# Patient Record
Sex: Female | Born: 1985 | Race: White | Hispanic: No | Marital: Married | Smoking: Never smoker
Health system: Southern US, Community
[De-identification: ages and names within clinical notes are randomized; demographics above are authoritative.]

## PROBLEM LIST (undated history)

## (undated) ENCOUNTER — Ambulatory Visit: Admission: EM | Payer: PRIVATE HEALTH INSURANCE | Source: Home / Self Care

## (undated) DIAGNOSIS — K589 Irritable bowel syndrome without diarrhea: Secondary | ICD-10-CM

## (undated) DIAGNOSIS — Z8489 Family history of other specified conditions: Secondary | ICD-10-CM

## (undated) DIAGNOSIS — D649 Anemia, unspecified: Secondary | ICD-10-CM

## (undated) DIAGNOSIS — I341 Nonrheumatic mitral (valve) prolapse: Secondary | ICD-10-CM

## (undated) DIAGNOSIS — Z87442 Personal history of urinary calculi: Secondary | ICD-10-CM

---
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
## 2016-09-16 DIAGNOSIS — J309 Allergic rhinitis, unspecified: Secondary | ICD-10-CM | POA: Insufficient documentation

## 2016-09-16 DIAGNOSIS — F909 Attention-deficit hyperactivity disorder, unspecified type: Secondary | ICD-10-CM | POA: Insufficient documentation

## 2017-07-16 HISTORY — PX: LAPAROSCOPY: SHX197

## 2018-10-21 LAB — OB RESULTS CONSOLE RUBELLA ANTIBODY, IGM: Rubella: IMMUNE

## 2018-10-21 LAB — OB RESULTS CONSOLE HEPATITIS B SURFACE ANTIGEN: Hepatitis B Surface Ag: NEGATIVE

## 2018-12-16 NOTE — L&D Delivery Note (Signed)
Delivery Note  SVD viable female Apgars 9,10 over 2nd degree MLE.  Placenta delivered spontaneously intact with 3VC. Repair with 2-0 Chromic with good support and hemostasis noted.  R/V exam confirms.  PH art was sent.   Mother and baby to couplet care and are doing well.  EBL 508cc  Candice Camp, MD

## 2019-02-18 LAB — OB RESULTS CONSOLE HIV ANTIBODY (ROUTINE TESTING): HIV: NONREACTIVE

## 2019-04-01 LAB — OB RESULTS CONSOLE GC/CHLAMYDIA
Chlamydia: NEGATIVE
Gonorrhea: NEGATIVE

## 2019-04-29 LAB — OB RESULTS CONSOLE GBS: GBS: NEGATIVE

## 2019-05-18 ENCOUNTER — Other Ambulatory Visit: Payer: Self-pay

## 2019-05-18 ENCOUNTER — Inpatient Hospital Stay (HOSPITAL_COMMUNITY)
Admission: AD | Admit: 2019-05-18 | Discharge: 2019-05-20 | DRG: 807 | Disposition: A | Discharge status: Home / Self Care | Payer: PRIVATE HEALTH INSURANCE | Attending: Obstetrics and Gynecology | Admitting: Obstetrics and Gynecology

## 2019-05-18 ENCOUNTER — Encounter (HOSPITAL_COMMUNITY): Payer: Self-pay

## 2019-05-18 ENCOUNTER — Inpatient Hospital Stay (HOSPITAL_COMMUNITY): Payer: PRIVATE HEALTH INSURANCE | Admitting: Anesthesiology

## 2019-05-18 ENCOUNTER — Inpatient Hospital Stay (HOSPITAL_COMMUNITY): Payer: PRIVATE HEALTH INSURANCE

## 2019-05-18 DIAGNOSIS — Z3A38 38 weeks gestation of pregnancy: Secondary | ICD-10-CM

## 2019-05-18 DIAGNOSIS — O134 Gestational [pregnancy-induced] hypertension without significant proteinuria, complicating childbirth: Secondary | ICD-10-CM | POA: Diagnosis present

## 2019-05-18 DIAGNOSIS — O0990 Supervision of high risk pregnancy, unspecified, unspecified trimester: Secondary | ICD-10-CM

## 2019-05-18 DIAGNOSIS — Z3490 Encounter for supervision of normal pregnancy, unspecified, unspecified trimester: Secondary | ICD-10-CM

## 2019-05-18 DIAGNOSIS — Z1159 Encounter for screening for other viral diseases: Secondary | ICD-10-CM

## 2019-05-18 HISTORY — DX: Irritable bowel syndrome, unspecified: K58.9

## 2019-05-18 LAB — CBC
HCT: 33.7 % — ABNORMAL LOW (ref 36.0–46.0)
HCT: 33.4 % — ABNORMAL LOW (ref 36.0–46.0)
HCT: 32.9 % — ABNORMAL LOW (ref 36.0–46.0)
Hemoglobin: 11.2 g/dL — ABNORMAL LOW (ref 12.0–15.0)
Hemoglobin: 10.9 g/dL — ABNORMAL LOW (ref 12.0–15.0)
Hemoglobin: 10.9 g/dL — ABNORMAL LOW (ref 12.0–15.0)
MCH: 28.4 pg (ref 26.0–34.0)
MCH: 28.2 pg (ref 26.0–34.0)
MCH: 28.7 pg (ref 26.0–34.0)
MCHC: 33.2 g/dL (ref 30.0–36.0)
MCHC: 32.6 g/dL (ref 30.0–36.0)
MCHC: 33.1 g/dL (ref 30.0–36.0)
MCV: 85.5 fL (ref 80.0–100.0)
MCV: 86.5 fL (ref 80.0–100.0)
MCV: 86.6 fL (ref 80.0–100.0)
Platelets: 287 10*3/uL (ref 150–400)
Platelets: 281 10*3/uL (ref 150–400)
Platelets: 284 10*3/uL (ref 150–400)
RBC: 3.94 MIL/uL (ref 3.87–5.11)
RBC: 3.86 MIL/uL — ABNORMAL LOW (ref 3.87–5.11)
RBC: 3.8 MIL/uL — ABNORMAL LOW (ref 3.87–5.11)
RDW: 13.2 % (ref 11.5–15.5)
RDW: 13.4 % (ref 11.5–15.5)
RDW: 13.4 % (ref 11.5–15.5)
WBC: 13.6 10*3/uL — ABNORMAL HIGH (ref 4.0–10.5)
WBC: 11.1 10*3/uL — ABNORMAL HIGH (ref 4.0–10.5)
WBC: 22.9 10*3/uL — ABNORMAL HIGH (ref 4.0–10.5)
nRBC: 0 % (ref 0.0–0.2)
nRBC: 0 % (ref 0.0–0.2)
nRBC: 0 % (ref 0.0–0.2)

## 2019-05-18 LAB — COMPREHENSIVE METABOLIC PANEL
ALT: 23 U/L (ref 0–44)
AST: 26 U/L (ref 15–41)
Albumin: 3.1 g/dL — ABNORMAL LOW (ref 3.5–5.0)
Alkaline Phosphatase: 135 U/L — ABNORMAL HIGH (ref 38–126)
Anion gap: 10 (ref 5–15)
BUN: 12 mg/dL (ref 6–20)
CO2: 19 mmol/L — ABNORMAL LOW (ref 22–32)
Calcium: 9.4 mg/dL (ref 8.9–10.3)
Chloride: 105 mmol/L (ref 98–111)
Creatinine, Ser: 0.7 mg/dL (ref 0.44–1.00)
GFR calc Af Amer: >60 mL/min (ref >60)
GFR calc non Af Amer: >60 mL/min (ref >60)
Glucose, Bld: 85 mg/dL (ref 70–99)
Potassium: 3.6 mmol/L (ref 3.5–5.1)
Sodium: 134 mmol/L — ABNORMAL LOW (ref 135–145)
Total Bilirubin: 0.4 mg/dL (ref 0.3–1.2)
Total Protein: 5.8 g/dL — ABNORMAL LOW (ref 6.5–8.1)

## 2019-05-18 LAB — TYPE AND SCREEN
ABO/RH(D): O POS
Antibody Screen: NEGATIVE

## 2019-05-18 LAB — ABO/RH: ABO/RH(D): O POS

## 2019-05-18 LAB — RPR: RPR Ser Ql: NONREACTIVE

## 2019-05-18 LAB — SARS CORONAVIRUS 2 BY RT PCR (HOSPITAL ORDER, PERFORMED IN ~~LOC~~ HOSPITAL LAB): SARS Coronavirus 2: NEGATIVE

## 2019-05-18 MED ORDER — LIDOCAINE HCL (PF) 1 % IJ SOLN
INTRAMUSCULAR | Status: DC | PRN
  Administered 2019-05-18 (×2): 4 mL via EPIDURAL

## 2019-05-18 MED ORDER — PHENYLEPHRINE 40 MCG/ML (10ML) SYRINGE FOR IV PUSH (FOR BLOOD PRESSURE SUPPORT): 80.0000 ug | PREFILLED_SYRINGE | INTRAVENOUS | Status: DC | PRN

## 2019-05-18 MED ORDER — OXYCODONE-ACETAMINOPHEN 5-325 MG PO TABS: 1.0000 | ORAL_TABLET | ORAL | Status: DC | PRN

## 2019-05-18 MED ORDER — WITCH HAZEL-GLYCERIN EX PADS: 1.0000 "application " | MEDICATED_PAD | CUTANEOUS | Status: DC | PRN

## 2019-05-18 MED ORDER — ACETAMINOPHEN 325 MG PO TABS
650.0000 mg | ORAL_TABLET | ORAL | Status: DC | PRN
  Administered 2019-05-18: 650 mg via ORAL
  Filled 2019-05-18: qty 2

## 2019-05-18 MED ORDER — ZOLPIDEM TARTRATE 5 MG PO TABS: 5.0000 mg | ORAL_TABLET | Freq: Every evening | ORAL | Status: DC | PRN

## 2019-05-18 MED ORDER — ONDANSETRON HCL 4 MG/2ML IJ SOLN
4.0000 mg | Freq: Four times a day (QID) | INTRAMUSCULAR | Status: DC | PRN
  Administered 2019-05-18: 4 mg via INTRAVENOUS
  Filled 2019-05-18: qty 2

## 2019-05-18 MED ORDER — IBUPROFEN 600 MG PO TABS
600.0000 mg | ORAL_TABLET | Freq: Four times a day (QID) | ORAL | Status: DC
  Administered 2019-05-18 – 2019-05-20 (×7): 600 mg via ORAL
  Filled 2019-05-18 (×7): qty 1

## 2019-05-18 MED ORDER — TERBUTALINE SULFATE 1 MG/ML IJ SOLN: 0.2500 mg | Freq: Once | INTRAMUSCULAR | Status: DC | PRN

## 2019-05-18 MED ORDER — ACETAMINOPHEN 325 MG PO TABS: 650.0000 mg | ORAL_TABLET | ORAL | Status: DC | PRN

## 2019-05-18 MED ORDER — ONDANSETRON HCL 4 MG PO TABS: 4.0000 mg | ORAL_TABLET | ORAL | Status: DC | PRN

## 2019-05-18 MED ORDER — MISOPROSTOL 25 MCG QUARTER TABLET
25.0000 ug | ORAL_TABLET | ORAL | Status: DC | PRN
  Administered 2019-05-18 (×2): 25 ug via VAGINAL
  Filled 2019-05-18 (×2): qty 1

## 2019-05-18 MED ORDER — DIPHENHYDRAMINE HCL 50 MG/ML IJ SOLN: 12.5000 mg | INTRAMUSCULAR | Status: DC | PRN

## 2019-05-18 MED ORDER — OXYTOCIN 40 UNITS IN NORMAL SALINE INFUSION - SIMPLE MED
1.0000 m[IU]/min | INTRAVENOUS | Status: DC
  Administered 2019-05-18: 10:00:00 2 m[IU]/min via INTRAVENOUS
  Filled 2019-05-18: qty 1000

## 2019-05-18 MED ORDER — SODIUM CHLORIDE (PF) 0.9 % IJ SOLN
INTRAMUSCULAR | Status: DC | PRN
  Administered 2019-05-18: 12 mL/h via EPIDURAL

## 2019-05-18 MED ORDER — EPHEDRINE 5 MG/ML INJ: 10.0000 mg | INTRAVENOUS | Status: DC | PRN

## 2019-05-18 MED ORDER — FENTANYL-BUPIVACAINE-NACL 0.5-0.125-0.9 MG/250ML-% EP SOLN
12.0000 mL/h | EPIDURAL | Status: DC | PRN
  Filled 2019-05-18: qty 250

## 2019-05-18 MED ORDER — MEASLES, MUMPS & RUBELLA VAC IJ SOLR: 0.5000 mL | Freq: Once | INTRAMUSCULAR | Status: DC

## 2019-05-18 MED ORDER — FENTANYL CITRATE (PF) 100 MCG/2ML IJ SOLN
50.0000 ug | INTRAMUSCULAR | Status: DC | PRN
  Filled 2019-05-18: qty 2

## 2019-05-18 MED ORDER — TETANUS-DIPHTH-ACELL PERTUSSIS 5-2.5-18.5 LF-MCG/0.5 IM SUSP: 0.5000 mL | Freq: Once | INTRAMUSCULAR | Status: DC

## 2019-05-18 MED ORDER — DIBUCAINE (PERIANAL) 1 % EX OINT: 1.0000 "application " | TOPICAL_OINTMENT | CUTANEOUS | Status: DC | PRN

## 2019-05-18 MED ORDER — SENNOSIDES-DOCUSATE SODIUM 8.6-50 MG PO TABS
2.0000 | ORAL_TABLET | ORAL | Status: DC
  Administered 2019-05-18 – 2019-05-20 (×2): 2 via ORAL
  Filled 2019-05-18 (×2): qty 2

## 2019-05-18 MED ORDER — LIDOCAINE HCL (PF) 1 % IJ SOLN
30.0000 mL | INTRAMUSCULAR | Status: AC | PRN
  Administered 2019-05-18: 30 mL via SUBCUTANEOUS
  Filled 2019-05-18: qty 30

## 2019-05-18 MED ORDER — SIMETHICONE 80 MG PO CHEW: 80.0000 mg | CHEWABLE_TABLET | ORAL | Status: DC | PRN

## 2019-05-18 MED ORDER — MEDROXYPROGESTERONE ACETATE 150 MG/ML IM SUSP: 150.0000 mg | INTRAMUSCULAR | Status: DC | PRN

## 2019-05-18 MED ORDER — LACTATED RINGERS IV SOLN: 500.0000 mL | Freq: Once | INTRAVENOUS | Status: DC

## 2019-05-18 MED ORDER — OXYTOCIN 40 UNITS IN NORMAL SALINE INFUSION - SIMPLE MED: 2.5000 [IU]/h | INTRAVENOUS | Status: DC

## 2019-05-18 MED ORDER — SOD CITRATE-CITRIC ACID 500-334 MG/5ML PO SOLN: 30.0000 mL | ORAL | Status: DC | PRN

## 2019-05-18 MED ORDER — COCONUT OIL OIL
1.0000 "application " | TOPICAL_OIL | Status: DC | PRN
  Administered 2019-05-20: 1 via TOPICAL

## 2019-05-18 MED ORDER — OXYCODONE-ACETAMINOPHEN 5-325 MG PO TABS: 2.0000 | ORAL_TABLET | ORAL | Status: DC | PRN

## 2019-05-18 MED ORDER — LACTATED RINGERS IV SOLN
INTRAVENOUS | Status: DC
  Administered 2019-05-18 (×4): via INTRAVENOUS

## 2019-05-18 MED ORDER — LACTATED RINGERS IV SOLN
500.0000 mL | INTRAVENOUS | Status: DC | PRN
  Administered 2019-05-18: 01:00:00 200 mL via INTRAVENOUS

## 2019-05-18 MED ORDER — OXYTOCIN BOLUS FROM INFUSION
500.0000 mL | Freq: Once | INTRAVENOUS | Status: AC
  Administered 2019-05-18: 20:00:00 500 mL via INTRAVENOUS

## 2019-05-18 MED ORDER — DIPHENHYDRAMINE HCL 25 MG PO CAPS: 25.0000 mg | ORAL_CAPSULE | Freq: Four times a day (QID) | ORAL | Status: DC | PRN

## 2019-05-18 MED ORDER — BENZOCAINE-MENTHOL 20-0.5 % EX AERO
1.0000 "application " | INHALATION_SPRAY | CUTANEOUS | Status: DC | PRN
  Administered 2019-05-18 – 2019-05-20 (×2): 1 via TOPICAL
  Filled 2019-05-18 (×2): qty 56

## 2019-05-18 MED ORDER — PRENATAL MULTIVITAMIN CH
1.0000 | ORAL_TABLET | Freq: Every day | ORAL | Status: DC
  Administered 2019-05-19 – 2019-05-20 (×2): 1 via ORAL
  Filled 2019-05-18 (×2): qty 1

## 2019-05-18 MED ORDER — ONDANSETRON HCL 4 MG/2ML IJ SOLN: 4.0000 mg | INTRAMUSCULAR | Status: DC | PRN

## 2019-05-18 NOTE — Anesthesia Preprocedure Evaluation (Signed)
Anesthesia Evaluation  Patient identified by MRN, date of birth, ID band Patient awake    Reviewed: Allergy & Precautions, Patient's Chart, lab work & pertinent test results  Airway Mallampati: II  TM Distance: >3 FB Neck ROM: Full    Dental  (+) Dental Advisory Given   Pulmonary neg pulmonary ROS,    Pulmonary exam normal breath sounds clear to auscultation       Cardiovascular negative cardio ROS Normal cardiovascular exam Rhythm:Regular Rate:Normal     Neuro/Psych negative neurological ROS  negative psych ROS   GI/Hepatic negative GI ROS, Neg liver ROS,   Endo/Other  negative endocrine ROS  Renal/GU negative Renal ROS     Musculoskeletal negative musculoskeletal ROS (+)   Abdominal   Peds  Hematology negative hematology ROS (+)   Anesthesia Other Findings   Reproductive/Obstetrics (+) Pregnancy                             Anesthesia Physical Anesthesia Plan  ASA: II  Anesthesia Plan: Epidural   Post-op Pain Management:    Induction:   PONV Risk Score and Plan:   Airway Management Planned:   Additional Equipment:   Intra-op Plan:   Post-operative Plan:   Informed Consent: I have reviewed the patients History and Physical, chart, labs and discussed the procedure including the risks, benefits and alternatives for the proposed anesthesia with the patient or authorized representative who has indicated his/her understanding and acceptance.       Plan Discussed with:   Anesthesia Plan Comments:         Anesthesia Quick Evaluation

## 2019-05-18 NOTE — H&P (Signed)
Amber Daugherty is a 33 y.o. female presenting for IOL due to gestational HTN.  Last several week her BPs have been in the 140s/80-90s with a normal baseline of 110/60.  No PIH sxs.  IVF pregancy.  GBS -. OB History    Gravida  1   Para      Term      Preterm      AB      Living        SAB      TAB      Ectopic      Multiple      Live Births             Past Medical History:  Diagnosis Date  . Irritable bowel syndrome (IBS)    Past Surgical History:  Procedure Laterality Date  . ADENOIDECTOMY  1989  . LAPAROSCOPY  07/2017   Family History: family history is not on file. Social History:  reports that she has never smoked. She has never used smokeless tobacco. She reports that she does not drink alcohol or use drugs.     Maternal Diabetes: No Genetic Screening: Normal Maternal Ultrasounds/Referrals: Normal Fetal Ultrasounds or other Referrals:  None Maternal Substance Abuse:  No Significant Maternal Medications:  None Significant Maternal Lab Results:  None Other Comments:  None  ROS History Dilation: 2.5 Effacement (%): 80 Station: -2 Exam by:: Dagoberto Nealy Blood pressure 112/70, pulse 88, temperature 98.8 F (37.1 C), temperature source Oral, resp. rate 18, height 5\' 6"  (1.676 m), weight 86.8 kg, last menstrual period 08/20/2018. Exam Physical Exam  Prenatal labs: ABO, Rh: --/--/O POS, O POS Performed at [facility], [address], [place], [place] [address]  [phone] [id number]) Antibody: NEG (06/02 0039) Rubella: Immune (11/06 0000) RPR:    HBsAg: Negative (11/06 0000)  HIV: Non-reactive (03/05 0000)  GBS: Negative (05/14 0000)   Assessment/Plan: IUP at 38 6/7 Gestational HTN - BPs stable, no severe sxs, normal labs S/p cytotec, now with AROM clear fluid and will use pitocin today Anticipate SVD   Turner Daniels 05/18/2019, 9:25 AM

## 2019-05-18 NOTE — Anesthesia Procedure Notes (Signed)
Epidural Patient location during procedure: OB  Staffing Anesthesiologist: Cagney Degrace, MD Performed: anesthesiologist   Preanesthetic Checklist Completed: patient identified, pre-op evaluation, timeout performed, IV checked, risks and benefits discussed and monitors and equipment checked  Epidural Patient position: sitting Prep: site prepped and draped and DuraPrep Patient monitoring: heart rate, continuous pulse ox and blood pressure Approach: midline Location: L2-L3 Injection technique: LOR air and LOR saline  Needle:  Needle type: Tuohy  Needle gauge: 17 G Needle length: 9 cm Needle insertion depth: 6 cm Catheter type: closed end flexible Catheter size: 19 Gauge Catheter at skin depth: 11 cm Test dose: negative  Assessment Sensory level: T8 Events: blood not aspirated, injection not painful, no injection resistance, negative IV test and no paresthesia  Additional Notes Reason for block:procedure for pain     

## 2019-05-19 LAB — CBC
HCT: 28.7 % — ABNORMAL LOW (ref 36.0–46.0)
Hemoglobin: 9.3 g/dL — ABNORMAL LOW (ref 12.0–15.0)
MCH: 28.1 pg (ref 26.0–34.0)
MCHC: 32.4 g/dL (ref 30.0–36.0)
MCV: 86.7 fL (ref 80.0–100.0)
Platelets: 248 10*3/uL (ref 150–400)
RBC: 3.31 MIL/uL — ABNORMAL LOW (ref 3.87–5.11)
RDW: 13.4 % (ref 11.5–15.5)
WBC: 18.2 10*3/uL — ABNORMAL HIGH (ref 4.0–10.5)
nRBC: 0 % (ref 0.0–0.2)

## 2019-05-19 NOTE — Lactation Note (Signed)
This note was copied from a baby's chart. Lactation Consultation Note  Patient Name: Amber Daugherty [id number] Date: 05/19/2019 Reason for consult: Follow-up assessment;Early term 18-38.6wks Baby is 16 hours old.  Mom feels feedings are going well.  Baby prefers left side.  She is using cross cradle hold.  Reviewed waking techniques and breast massage.  Baby recently fed and is sleeping in crib.  Mom may call out for feeding assessment later today.  Maternal Data    Feeding Feeding Type: Breast Fed  LATCH Score                   Interventions    Lactation Tools Discussed/Used     Consult Status Consult Status: Follow-up Date: 05/20/19 Follow-up type: In-patient    Huston Foley 05/19/2019, 2:55 PM

## 2019-05-19 NOTE — Anesthesia Postprocedure Evaluation (Signed)
Anesthesia Post Note  Patient: Amber Daugherty  Procedure(s) Performed: AN AD HOC LABOR EPIDURAL     Patient location during evaluation: Mother Baby Anesthesia Type: Epidural Level of consciousness: awake and alert Pain management: pain level controlled Vital Signs Assessment: post-procedure vital signs reviewed and stable Respiratory status: spontaneous breathing, nonlabored ventilation and respiratory function stable Cardiovascular status: stable Postop Assessment: no headache, no backache, epidural receding, no apparent nausea or vomiting, patient able to bend at knees, able to ambulate and adequate PO intake Anesthetic complications: no    Last Vitals:  Vitals:   05/19/19 0300 05/19/19 0640  BP: 116/71 119/75  Pulse: 96 77  Resp: 20 18  Temp: 36.7 C 36.7 C  SpO2:      Last Pain:  Vitals:   05/19/19 0640  TempSrc: Oral  PainSc: 2    Pain Goal:                   Laban Emperor

## 2019-05-19 NOTE — Lactation Note (Signed)
This note was copied from a baby's chart. Lactation Consultation Note  Patient Name: Amber Daugherty [id number] Date: 05/19/2019 Reason for consult: Initial assessment;Early term 37-38.6wks P1, 7 hour female infant, ETI Mom has DEBP at home. Per mom,  infant BF 2x and had 2 stools and one void since birth.  Per mom, infant has been latching well on left breast but not right. Mom latched infant on right breast using the foot ball hold position, after a few attempts infant sustained latch was off and on at first, infant  breastfeed for 15 minutes. Mom has semi  inverted nipples (dimpling) but infant latches well.  Mom taught back hand expression and infant given 5 ml of colostrum by spoon. Mom knows to breastfeed by hunger cues, 8 to 12 times within 24 hours and breastfeeding on demand. LC discussed I & O. Reviewed Baby & Me book's Breastfeeding Basics.  Mom made aware of O/P services, breastfeeding support groups, community resources, and our phone # for post-discharge questions.    Maternal Data Formula Feeding for Exclusion: No Has patient been taught Hand Expression?: Yes(Infant given 5 ml of colostrum by spoon.) Does the patient have breastfeeding experience prior to this delivery?: No  Feeding Feeding Type: Breast Fed  LATCH Score Latch: Grasps breast easily, tongue down, lips flanged, rhythmical sucking.  Audible Swallowing: Spontaneous and intermittent  Type of Nipple: Inverted  Comfort (Breast/Nipple): Soft / non-tender  Hold (Positioning): Assistance needed to correctly position infant at breast and maintain latch.  LATCH Score: 7  Interventions Interventions: Breast feeding basics reviewed;Assisted with latch;Skin to skin;Breast massage;Hand express;Support pillows;Adjust position;Breast compression;Expressed milk  Lactation Tools Discussed/Used WIC Program: No   Consult Status Consult Status: Follow-up Date: 05/19/19 Follow-up type: In-patient    Danelle Earthly 05/19/2019, 3:35 AM

## 2019-05-19 NOTE — Progress Notes (Signed)
Patient doing well. No complaints.  BP 119/75 (BP Location: Left Arm)   Pulse 77   Temp 98.1 F (36.7 C) (Oral)   Resp 18   Ht 5\' 6"  (1.676 m)   Wt 86.8 kg   LMP 08/20/2018 (Approximate)   SpO2 98%   Breastfeeding Unknown   BMI 30.89 kg/m  Results for orders placed or performed during the hospital encounter of 05/18/19 (from the past 24 hour(s))  CBC     Status: Abnormal   Collection Time: 05/18/19 10:17 AM  Result Value Ref Range   WBC 11.1 (H) 4.0 - 10.5 K/uL   RBC 3.86 (L) 3.87 - 5.11 MIL/uL   Hemoglobin 10.9 (L) 12.0 - 15.0 g/dL   HCT 78.2 (L) 42.3 - 53.6 %   MCV 86.5 80.0 - 100.0 fL   MCH 28.2 26.0 - 34.0 pg   MCHC 32.6 30.0 - 36.0 g/dL   RDW 14.4 31.5 - 40.0 %   Platelets 281 150 - 400 K/uL   nRBC 0.0 0.0 - 0.2 %  CBC     Status: Abnormal   Collection Time: 05/18/19  9:34 PM  Result Value Ref Range   WBC 22.9 (H) 4.0 - 10.5 K/uL   RBC 3.80 (L) 3.87 - 5.11 MIL/uL   Hemoglobin 10.9 (L) 12.0 - 15.0 g/dL   HCT 86.7 (L) 61.9 - 50.9 %   MCV 86.6 80.0 - 100.0 fL   MCH 28.7 26.0 - 34.0 pg   MCHC 33.1 30.0 - 36.0 g/dL   RDW 32.6 71.2 - 45.8 %   Platelets 284 150 - 400 K/uL   nRBC 0.0 0.0 - 0.2 %  CBC     Status: Abnormal   Collection Time: 05/19/19  5:02 AM  Result Value Ref Range   WBC 18.2 (H) 4.0 - 10.5 K/uL   RBC 3.31 (L) 3.87 - 5.11 MIL/uL   Hemoglobin 9.3 (L) 12.0 - 15.0 g/dL   HCT 09.9 (L) 83.3 - 82.5 %   MCV 86.7 80.0 - 100.0 fL   MCH 28.1 26.0 - 34.0 pg   MCHC 32.4 30.0 - 36.0 g/dL   RDW 05.3 97.6 - 73.4 %   Platelets 248 150 - 400 K/uL   nRBC 0.0 0.0 - 0.2 %   Abdomen is soft and non tender  PPD # 1  Doing well Routine care

## 2019-05-20 ENCOUNTER — Encounter (HOSPITAL_COMMUNITY): Payer: Self-pay | Admitting: *Deleted

## 2019-05-20 NOTE — Lactation Note (Signed)
This note was copied from a baby's chart. Lactation Consultation Note Baby 32 hrs old. Mom states baby cluster feeding and feeding well. Mom states baby feeds well from both breast but latches better to Lt. Breast. Noted Rt. Nipple short shaft. Everts well w/stimulation. Shells given. Hand expressed colostrum easily 5 ml.  Mom has hand and DEBP at home. Encouraged mom to massage breast at intervals during feeding. Assess breast for transfer after feeding. Breast care, engorgement, milk storage, supply and demand discussed. Attempted to spoon feed colostrum. Baby took 2 ml.  Baby has good extension of tongue out of mouth past lips. Discussed newborn feeding habits, and answered questions parents had. Encouraged to call for questions or concerns.  Patient Name: Amber Daugherty [id number] Date: 05/20/2019 Reason for consult: 1st time breastfeeding;Follow-up assessment;Early term 37-38.6wks   Maternal Data    Feeding Feeding Type: Breast Milk  LATCH Score Latch: Grasps breast easily, tongue down, lips flanged, rhythmical sucking.  Audible Swallowing: Spontaneous and intermittent  Type of Nipple: Everted at rest and after stimulation  Comfort (Breast/Nipple): Soft / non-tender  Hold (Positioning): Assistance needed to correctly position infant at breast and maintain latch.  LATCH Score: 9  Interventions Interventions: Breast feeding basics reviewed;Breast compression;Adjust position;Support pillows;Breast massage;Position options;Hand express;Expressed milk;Pre-pump if needed;Shells  Lactation Tools Discussed/Used Tools: Shells Shell Type: Inverted   Consult Status Consult Status: Complete Date: 05/20/19    Charyl Dancer 05/20/2019, 3:50 AM

## 2019-05-20 NOTE — Discharge Summary (Signed)
Obstetric Discharge Summary Reason for Admission: induction of labor Prenatal Procedures: ultrasound Intrapartum Procedures: spontaneous vaginal delivery Postpartum Procedures: none Complications-Operative and Postpartum: none Hemoglobin  Date Value Ref Range Status  05/19/2019 9.3 (L) 12.0 - 15.0 g/dL Final   HCT  Date Value Ref Range Status  05/19/2019 28.7 (L) 36.0 - 46.0 % Final    Physical Exam:  General: alert and cooperative Lochia: appropriate Uterine Fundus: firm Incision: n/a DVT Evaluation: No evidence of DVT seen on physical exam.  Discharge Diagnoses: Term Pregnancy-delivered  Discharge Information: Date: 05/20/2019 Activity: pelvic rest Diet: routine Medications: PNV and Ibuprofen Condition: stable Instructions: refer to practice specific booklet Discharge to: home Follow-up Information    [facility] Schedule an appointment as soon as possible for a visit in 6 week(s).   Contact information: [address] [place] [address] [phone]           Newborn Data: Live born female  Birth Weight: 7 lb 5.5 oz (3331 g) APGAR: 9, 10  Newborn Delivery   Birth date/time:  05/18/2019 19:40:00 Delivery type:  Vaginal, Spontaneous     Home with mother.  Amber Daugherty 05/20/2019, 10:01 AM

## 2020-07-04 DIAGNOSIS — D563 Thalassemia minor: Secondary | ICD-10-CM | POA: Insufficient documentation

## 2020-11-19 ENCOUNTER — Other Ambulatory Visit: Payer: Self-pay

## 2020-11-19 ENCOUNTER — Inpatient Hospital Stay (HOSPITAL_COMMUNITY)
Admission: AD | Admit: 2020-11-19 | Discharge: 2020-11-19 | Disposition: A | Discharge status: Home / Self Care | Payer: No Typology Code available for payment source | Attending: Obstetrics and Gynecology | Admitting: Obstetrics and Gynecology

## 2020-11-19 ENCOUNTER — Encounter (HOSPITAL_COMMUNITY): Payer: Self-pay | Admitting: Obstetrics and Gynecology

## 2020-11-19 ENCOUNTER — Inpatient Hospital Stay (HOSPITAL_COMMUNITY): Payer: No Typology Code available for payment source

## 2020-11-19 DIAGNOSIS — Z882 Allergy status to sulfonamides status: Secondary | ICD-10-CM | POA: Diagnosis not present

## 2020-11-19 DIAGNOSIS — O039 Complete or unspecified spontaneous abortion without complication: Secondary | ICD-10-CM | POA: Diagnosis not present

## 2020-11-19 DIAGNOSIS — Z880 Allergy status to penicillin: Secondary | ICD-10-CM | POA: Insufficient documentation

## 2020-11-19 DIAGNOSIS — Z883 Allergy status to other anti-infective agents status: Secondary | ICD-10-CM | POA: Diagnosis not present

## 2020-11-19 DIAGNOSIS — O208 Other hemorrhage in early pregnancy: Secondary | ICD-10-CM

## 2020-11-19 DIAGNOSIS — O468X1 Other antepartum hemorrhage, first trimester: Secondary | ICD-10-CM

## 2020-11-19 DIAGNOSIS — O418X10 Other specified disorders of amniotic fluid and membranes, first trimester, not applicable or unspecified: Secondary | ICD-10-CM

## 2020-11-19 HISTORY — DX: Nonrheumatic mitral (valve) prolapse: I34.1

## 2020-11-19 LAB — URINALYSIS, ROUTINE W REFLEX MICROSCOPIC
Bilirubin Urine: NEGATIVE
Glucose, UA: NEGATIVE mg/dL
Ketones, ur: NEGATIVE mg/dL
Leukocytes,Ua: NEGATIVE
Nitrite: NEGATIVE
Protein, ur: NEGATIVE mg/dL
Specific Gravity, Urine: 1.008 (ref 1.005–1.030)
pH: 7 (ref 5.0–8.0)

## 2020-11-19 NOTE — MAU Note (Signed)
Amber Daugherty is a 34 y.o. at [redacted]w[redacted]d here in MAU reporting: had an u/s on Tuesday in the office and everything looked good, states they told her she had a subchorionic hematoma. States she had been spotting and yesterday the bleeding increased a little bit. Today started noticing some cramping and noticed a gush of blood.  Onset of complaint: ongoing  Pain score: 4/10  Vitals:   11/19/20 1751  BP: 118/76  Pulse: 97  Resp: 16  Temp: 98.5 F (36.9 C)  SpO2: 99%     Lab orders placed from triage: UA

## 2020-11-19 NOTE — Discharge Instructions (Signed)

## 2020-11-19 NOTE — MAU Provider Note (Signed)
Chief Complaint: Abdominal Pain and Vaginal Bleeding   First Provider Initiated Contact with Patient 11/19/20 1818        SUBJECTIVE HPI: Amber Daugherty is a 34 y.o. G2P1001 at [redacted]w[redacted]d by LMP who presents to maternity admissions reporting spotting this week which became heavier tonight, also had stronger cramping.  Was told she had a subchorionic hematoma on [place] but they did not say how large.  They told her it was nothing to worry about. . She denies vaginal bleeding, vaginal itching/burning, urinary symptoms, h/a, dizziness, n/v, or fever/chills.    Abdominal Pain This is a new problem. The current episode started in the past 7 days. The onset quality is gradual. The problem occurs intermittently. The problem has been unchanged. The pain is located in the suprapubic region, RLQ and LLQ. The quality of the pain is cramping. The abdominal pain does not radiate. Pertinent negatives include no constipation, diarrhea, fever or vomiting. Nothing aggravates the pain. The pain is relieved by nothing. She has tried nothing for the symptoms.  Vaginal Bleeding The patient's primary symptoms include pelvic pain and vaginal bleeding. The patient's pertinent negatives include no genital itching, genital lesions or genital odor. This is a new problem. The current episode started in the past 7 days. The problem occurs intermittently. The problem has been gradually worsening. She is pregnant. Associated symptoms include abdominal pain. Pertinent negatives include no constipation, diarrhea, fever or vomiting. The vaginal discharge was bloody. The vaginal bleeding is lighter than menses (Heavier today). She has not been passing clots. She has not been passing tissue. Nothing aggravates the symptoms. She has tried nothing for the symptoms.   RN Note: Amber Daugherty is a 34 y.o. at [redacted]w[redacted]d here in MAU reporting: had an u/s on Tuesday in the office and everything looked good, states they told her she had a subchorionic hematoma.  States she had been spotting and yesterday the bleeding increased a little bit. Today started noticing some cramping and noticed a gush of blood. Onset of complaint: ongoing Pain score: 4/10  Past Medical History:  Diagnosis Date  . Irritable bowel syndrome (IBS)   . Mitral valve prolapse    Pt reported    Past Surgical History:  Procedure Laterality Date  . ADENOIDECTOMY  1989  . LAPAROSCOPY  07/2017   Social History   Socioeconomic History  . Marital status: Married    Spouse name: Not on file  . Number of children: Not on file  . Years of education: Not on file  . Highest education level: Not on file  Occupational History  . Not on file  Tobacco Use  . Smoking status: Never Smoker  . Smokeless tobacco: Never Used  Vaping Use  . Vaping Use: Never used  Substance and Sexual Activity  . Alcohol use: Never  . Drug use: Never  . Sexual activity: Not on file  Other Topics Concern  . Not on file  Social History Narrative  . Not on file   Social Determinants of Health   Financial Resource Strain:   . Difficulty of Paying Living Expenses: Not on file  Food Insecurity:   . Worried About Programme researcher, broadcasting/film/video in the Last Year: Not on file  . Ran Out of Food in the Last Year: Not on file  Transportation Needs:   . Lack of Transportation (Medical): Not on file  . Lack of Transportation (Non-Medical): Not on file  Physical Activity:   . Days of Exercise per Week: Not  on file  . Minutes of Exercise per Session: Not on file  Stress:   . Feeling of Stress : Not on file  Social Connections:   . Frequency of Communication with Friends and Family: Not on file  . Frequency of Social Gatherings with Friends and Family: Not on file  . Attends Religious Services: Not on file  . Active Member of Clubs or Organizations: Not on file  . Attends Banker Meetings: Not on file  . Marital Status: Not on file  Intimate Partner Violence:   . Fear of Current or Ex-Partner:  Not on file  . Emotionally Abused: Not on file  . Physically Abused: Not on file  . Sexually Abused: Not on file   No current facility-administered medications on file prior to encounter.   Current Outpatient Medications on File Prior to Encounter  Medication Sig Dispense Refill  . Prenatal Vit-Fe Fumarate-FA (PRENATAL MULTIVITAMIN) TABS tablet Take 1 tablet by mouth daily at 12 noon.    . progesterone 200 MG SUPP Place 200 mg vaginally at bedtime.     Allergies  Allergen Reactions  . Amoxicillin   . Ketoconazole   . Sulfa Antibiotics     I have reviewed patient's Past Medical Hx, Surgical Hx, Family Hx, Social Hx, medications and allergies.   ROS:  Review of Systems  Constitutional: Negative for fever.  Gastrointestinal: Positive for abdominal pain. Negative for constipation, diarrhea and vomiting.  Genitourinary: Positive for pelvic pain and vaginal bleeding.   Review of Systems  Other systems negative   Physical Exam  Physical Exam Patient Vitals for the past 24 hrs:  BP Temp Temp src Pulse Resp SpO2 Height Weight  11/19/20 1751 118/76 98.5 F (36.9 C) Oral 97 16 99 % -- --  11/19/20 1746 -- -- -- -- -- -- 5' 6.5" (1.689 m) 66.1 kg   Constitutional: Well-developed, well-nourished female in no acute distress.  Cardiovascular: normal rate Respiratory: normal effort GI: Abd soft, non-tender.  MS: Extremities nontender, no edema, normal ROM Neurologic: Alert and oriented x 4.  GU: Neg CVAT.  PELVIC EXAM: Pelvic exam deferred since I am ordering an [place].  Externally, bleeding is light.   LAB RESULTS Blood type O Positive   IMAGING US OB Transvaginal  Result Date: 11/19/2020 CLINICAL DATA:  Initial evaluation for acute vaginal bleeding, early pregnancy. EXAM: TRANSVAGINAL OB ULTRASOUND TECHNIQUE: Transvaginal ultrasound was performed for complete evaluation of the gestation as well as the maternal uterus, adnexal regions, and pelvic cul-de-sac. COMPARISON:  None.  FINDINGS: Intrauterine gestational sac: Negative. Yolk sac:  Negative. Embryo:  Negative. Cardiac Activity: Negative. Heart Rate: N/A Subchorionic hemorrhage:  None visualized. Maternal uterus/adnexae: Ovaries are within normal limits bilaterally. No adnexal mass. Endometrial stripe heterogeneous measuring up to 14 mm in thickness. No significant associated vascularity. IMPRESSION: 1. Early pregnancy with no discrete IUP or adnexal mass identified. Finding is consistent with a pregnancy of unknown anatomic location. Differential considerations include IUP to early to visualize, recent SAB, or possibly occult ectopic pregnancy. Close clinical monitoring with serial beta HCGs and close interval follow-up ultrasound recommended as clinically warranted. 2. No other acute maternal uterine or adnexal abnormality. Electronically Signed   By: Rise Mu M.D.   On: 11/19/2020 19:05     MAU Management/MDM: Ordered transvaginal ultrasound to check viability of fetus and size of [facility] US showed complete loss of pregnancy Discussed with patient and her husband who are grieving Dr Rana Snare called and said he had seen  [place]. He will schedule followup next week. I conveyed his condolences to patient and her husband.   ASSESSMENT Single IUP at [redacted]w[redacted]d Complete spontaneous abortion  PLAN Discharge home Bleeding precautions Office will call to schedule appt  Pt stable at time of discharge. Encouraged to return here if she develops worsening of symptoms, increase in pain, fever, or other concerning symptoms.    Wynelle Bourgeois CNM, MSN Certified Nurse-Midwife 11/19/2020  6:18 PM

## 2021-04-27 IMAGING — US US OB TRANSVAGINAL
1 series · 15 of 26 positions shown · non-contrast
Comparison: None.

CLINICAL DATA: Initial evaluation for acute vaginal bleeding, early
pregnancy.

EXAM:
TRANSVAGINAL OB ULTRASOUND
TECHNIQUE: Transvaginal ultrasound was performed for complete evaluation of the
gestation as well as the maternal uterus, adnexal regions, and
pelvic cul-de-sac.

[Series 1: us ob transvaginal · 26 acquisitions, 15 frames shown]
[im 1/26]
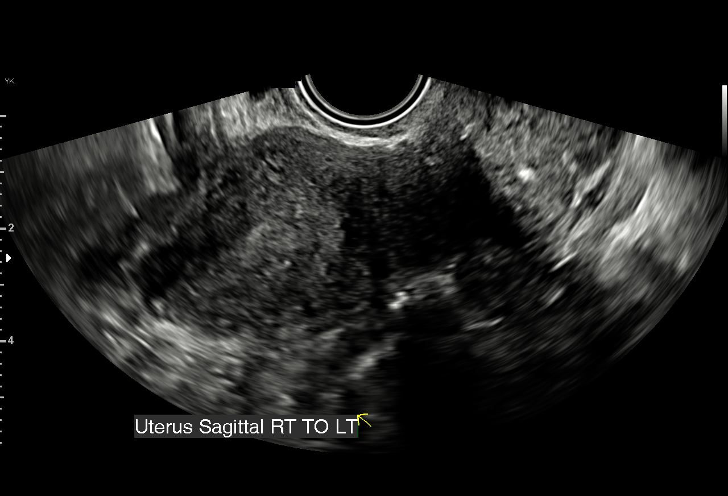
[im 3/26]
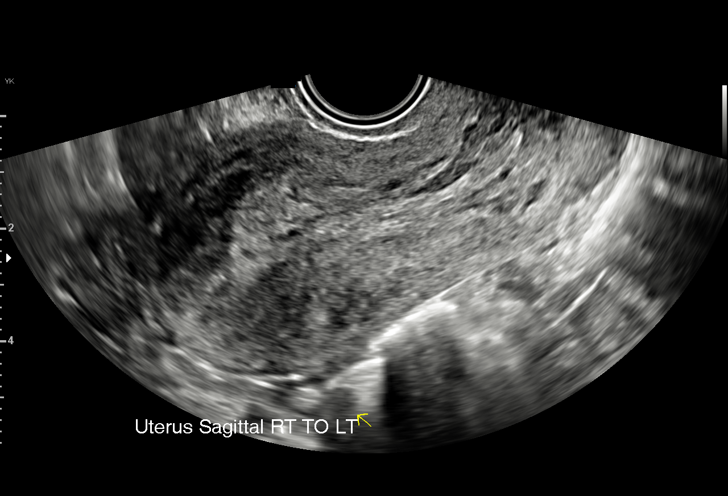
[im 5/26]
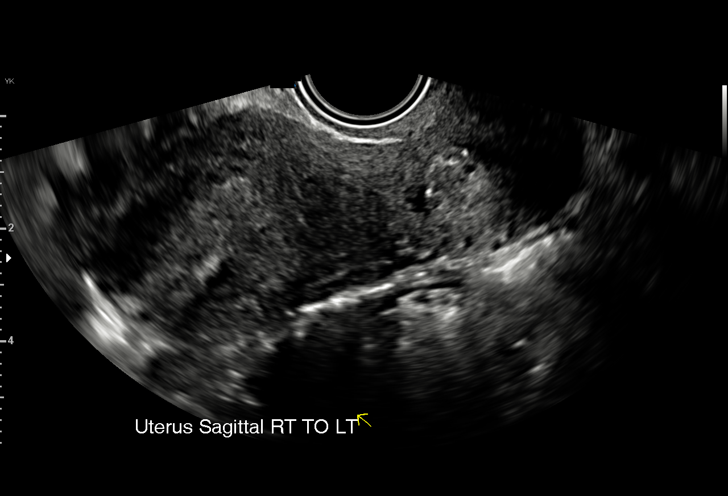
[im 7/26]
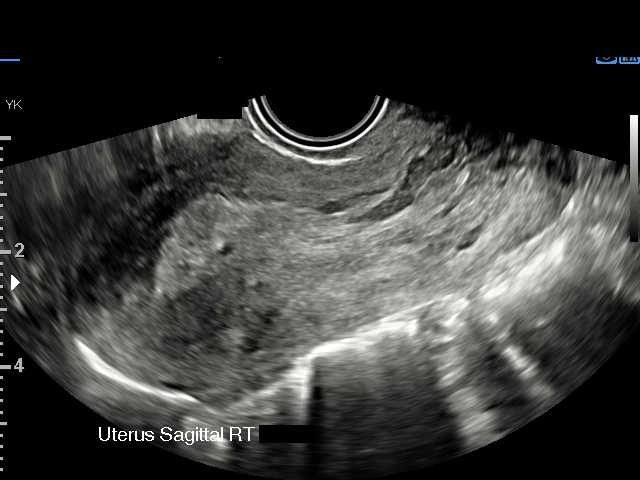
[im 8/26]
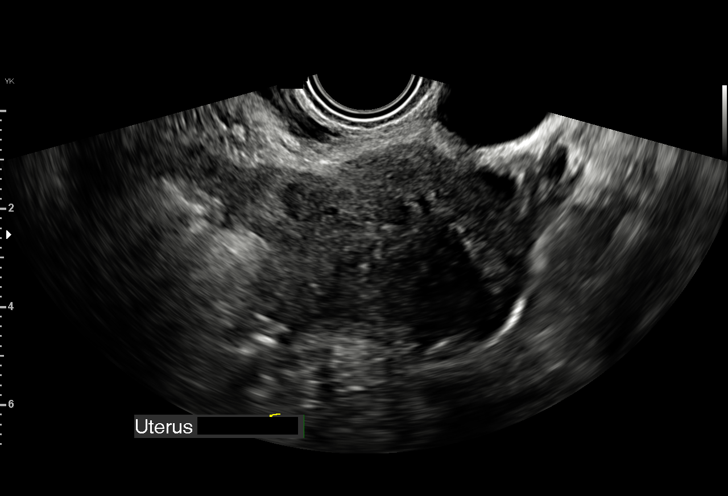
[im 10/26]
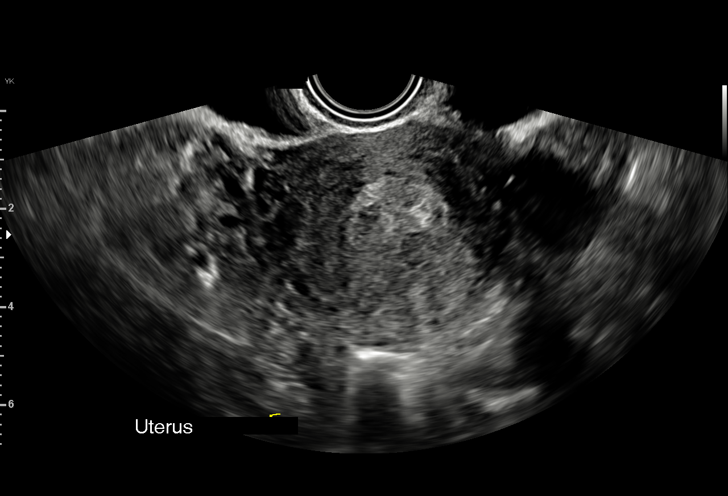
[im 12/26]
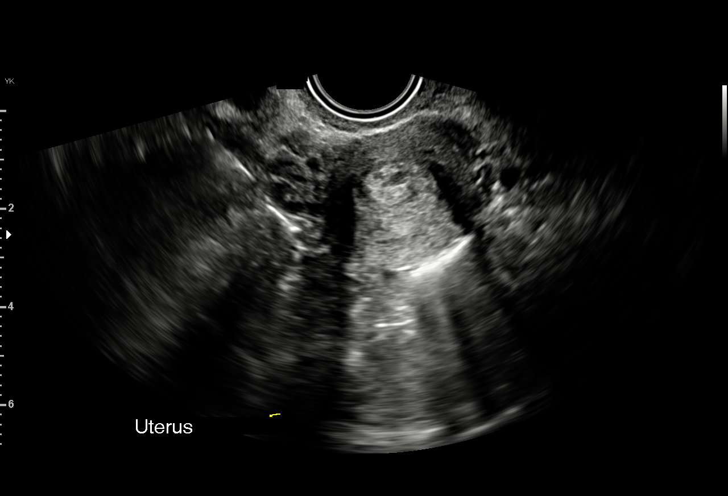
[im 14/26]
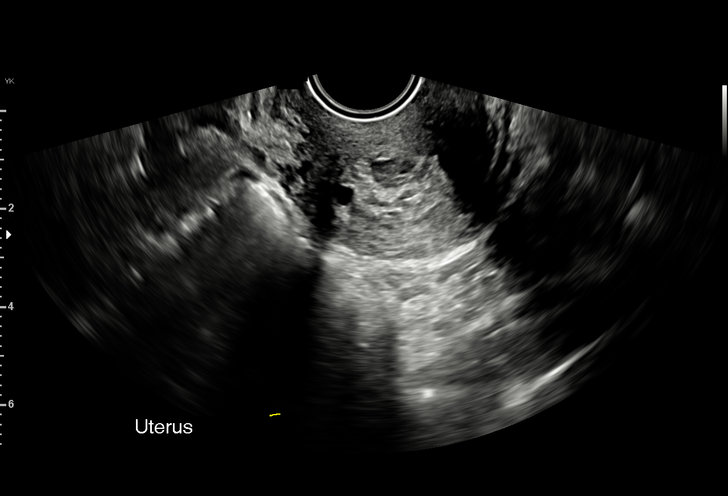
[im 15/26]
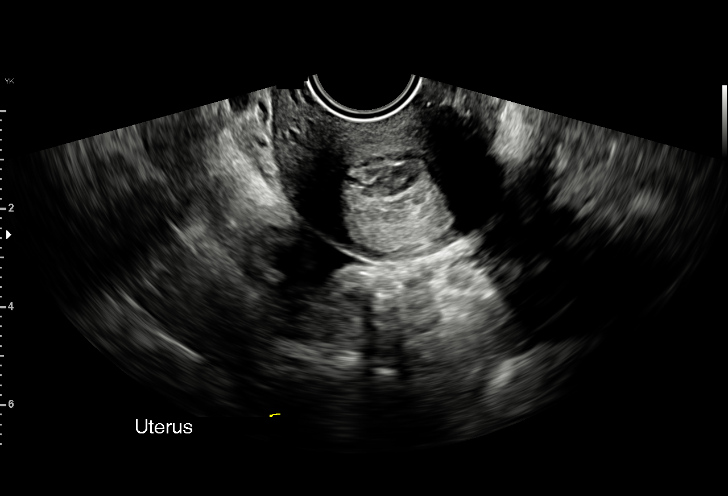
[im 17/26]
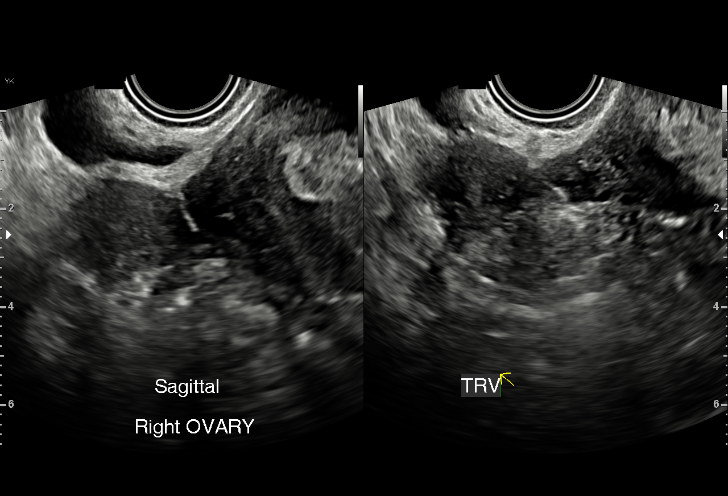
[im 19/26]
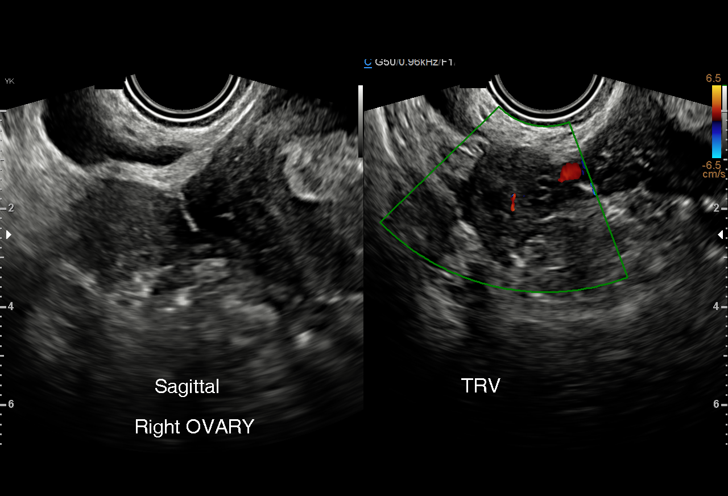
[im 20/26]
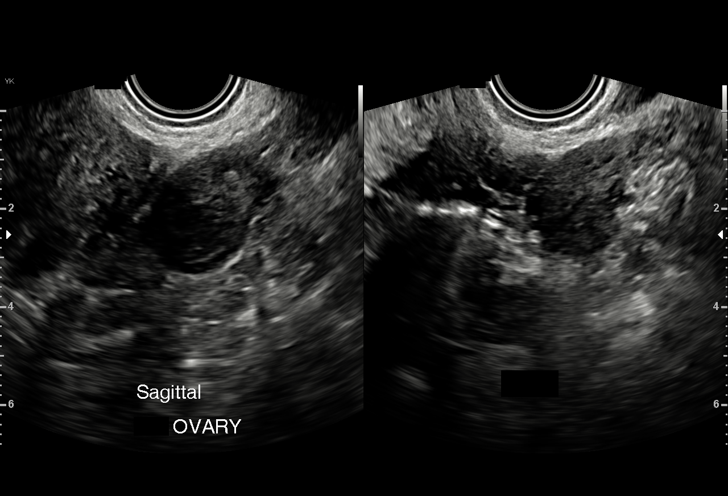
[im 22/26]
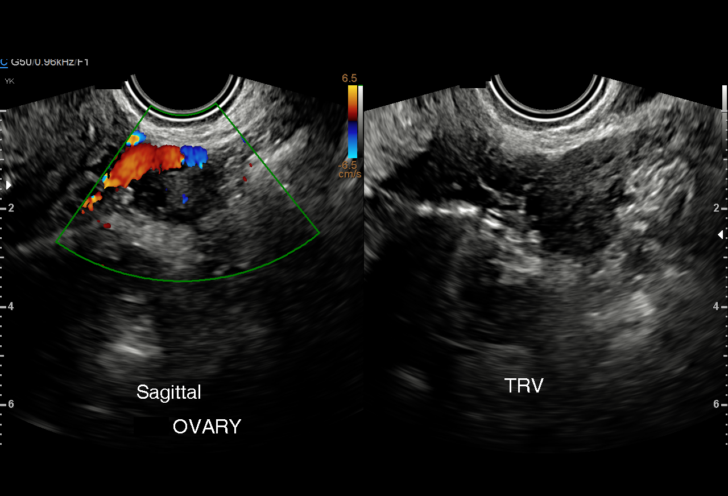
[im 24/26]
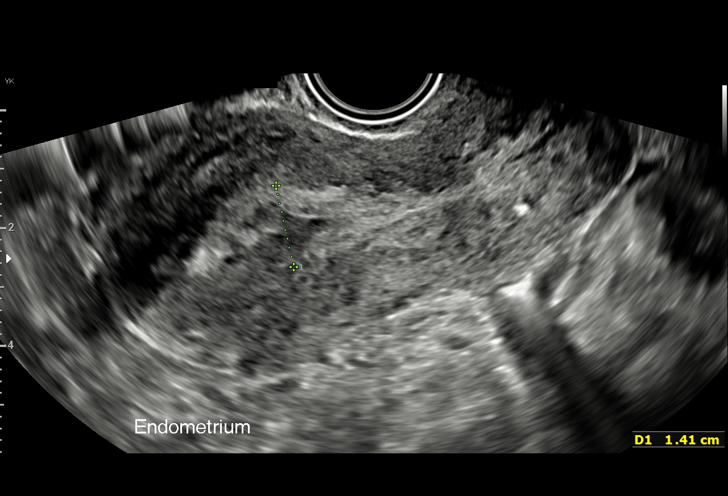
[im 26/26]
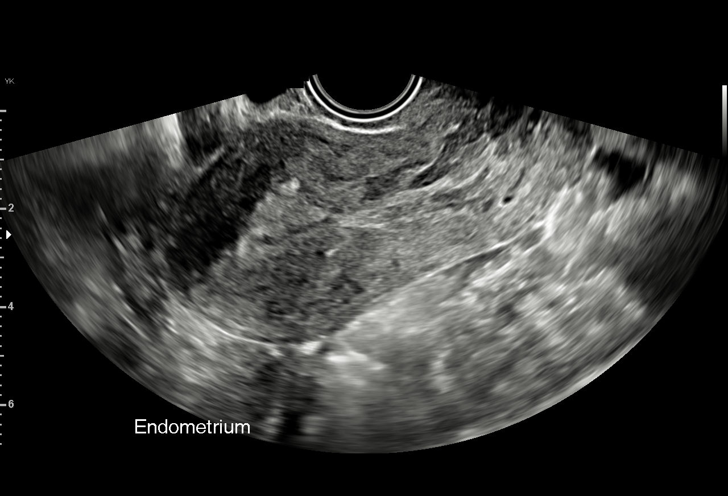

[15 of 26 positions shown; findings below may reference images not displayed]

FINDINGS: Intrauterine gestational sac: Negative.

Yolk sac:  Negative.

Embryo:  Negative.

Cardiac Activity: Negative.

Heart Rate: N/A

Subchorionic hemorrhage:  None visualized.

Maternal uterus/adnexae: Ovaries are within normal limits
bilaterally. No adnexal mass.

Endometrial stripe heterogeneous measuring up to 14 mm in thickness.
No significant associated vascularity.
IMPRESSION: 1. Early pregnancy with no discrete IUP or adnexal mass identified.
Finding is consistent with a pregnancy of unknown anatomic location.
Differential considerations include IUP to early to visualize,
recent SAB, or possibly occult ectopic pregnancy. Close clinical
monitoring with serial beta HCGs and close interval follow-up
ultrasound recommended as clinically warranted.
2. No other acute maternal uterine or adnexal abnormality.

## 2022-12-16 NOTE — L&D Delivery Note (Signed)
   Delivery Note:   G3P1011 at [redacted]w[redacted]d  Admitting diagnosis: Normal labor [O80, Z37.9]  Risks:  Nephrostomy tube in place, inpatient at 24+ wks for severe blockage R kidney, ruptured calycle fornix. Nephrostomy placed under IR, tube changes q 4 weeks. Plan transcutaneous nephrolithiasis after delivery. Dr. Berneice Heinrich urology. Recurrent UTI with E. Coli, was on Keflex 500mg  for suppression Complicated UTI, diagnosed 8/22, culture grew serratia marcescens, admitted for PICC line placement and started cefepime q 24 hours for 7 days, discharged yesterday with home health Suspected LGA, EFW 94% at 34 weeks Anemia, on PO Accrufer  Onset of labor: 09/10/2023 at 0700 IOL/Augmentation: N/A ROM: 09/10/2023 at 0700, clear fluid  Complete dilation at 09/10/2023 1100 Onset of pushing at 1100 FHR second stage reassuring  Analgesia/Anesthesia intrapartum:Local Pushing in lithotomy position with CNM and L&D staff support. Husband, Tresa Endo, and friend, Brayton Caves, present for birth and supportive.  Delivery of a Live born female  Birth Weight:  pending APGAR: 8, 9  Newborn Delivery   Birth date/time: 09/10/2023 11:03:22 Delivery type: Vaginal, Spontaneous    in cephalic presentation, position OA to LOA.  APGAR:1 min-8 , 5 min-9   Nuchal Cord: No  Cord double clamped after cessation of pulsation, cut by Tresa Endo.  Collection of cord blood for typing completed. Arterial cord blood sample-No   Placenta delivered-Spontaneous with 3 vessels. Uterotonics: None Placenta to L&D Uterine tone firm  Bleeding scant  1st degree;Perineal laceration identified.  Episiotomy:None Local analgesia: Lidocaine  Repair: 3-0 in figure of eight Est. Blood Loss (mL):84.00  Complications: None  Mom to postpartum. Baby Romeo Apple to Couplet care / Skin to Skin.  Delivery Report:   Review the Delivery Report for details.    June Leap, CNM, MSN 09/10/2023, 11:29 AM

## 2023-02-04 LAB — OB RESULTS CONSOLE ABO/RH: RH Type: POSITIVE

## 2023-03-11 LAB — OB RESULTS CONSOLE HEPATITIS B SURFACE ANTIGEN: Hepatitis B Surface Ag: NEGATIVE

## 2023-03-11 LAB — OB RESULTS CONSOLE HIV ANTIBODY (ROUTINE TESTING): HIV: NONREACTIVE

## 2023-03-11 LAB — OB RESULTS CONSOLE GC/CHLAMYDIA
Chlamydia: NEGATIVE
Neisseria Gonorrhea: NEGATIVE

## 2023-03-11 LAB — OB RESULTS CONSOLE RUBELLA ANTIBODY, IGM: Rubella: IMMUNE

## 2023-03-11 LAB — HEPATITIS C ANTIBODY: HCV Ab: NEGATIVE

## 2023-03-11 LAB — OB RESULTS CONSOLE RPR: RPR: NONREACTIVE

## 2023-06-15 ENCOUNTER — Inpatient Hospital Stay (HOSPITAL_COMMUNITY)
Admission: AD | Admit: 2023-06-15 | Discharge: 2023-06-17 | DRG: 832 | Disposition: A | Discharge status: Home / Self Care | Payer: No Typology Code available for payment source | Attending: Obstetrics & Gynecology | Admitting: Obstetrics & Gynecology

## 2023-06-15 ENCOUNTER — Other Ambulatory Visit: Payer: Self-pay

## 2023-06-15 ENCOUNTER — Encounter (HOSPITAL_COMMUNITY): Payer: Self-pay | Admitting: Obstetrics and Gynecology

## 2023-06-15 ENCOUNTER — Inpatient Hospital Stay (HOSPITAL_COMMUNITY): Payer: No Typology Code available for payment source

## 2023-06-15 DIAGNOSIS — Z87440 Personal history of urinary (tract) infections: Secondary | ICD-10-CM | POA: Diagnosis not present

## 2023-06-15 DIAGNOSIS — N202 Calculus of kidney with calculus of ureter: Secondary | ICD-10-CM | POA: Diagnosis present

## 2023-06-15 DIAGNOSIS — O2302 Infections of kidney in pregnancy, second trimester: Principal | ICD-10-CM | POA: Diagnosis present

## 2023-06-15 DIAGNOSIS — O26832 Pregnancy related renal disease, second trimester: Secondary | ICD-10-CM | POA: Diagnosis present

## 2023-06-15 DIAGNOSIS — O2312 Infections of bladder in pregnancy, second trimester: Secondary | ICD-10-CM | POA: Diagnosis present

## 2023-06-15 DIAGNOSIS — O99891 Other specified diseases and conditions complicating pregnancy: Secondary | ICD-10-CM | POA: Diagnosis present

## 2023-06-15 DIAGNOSIS — N209 Urinary calculus, unspecified: Secondary | ICD-10-CM | POA: Diagnosis present

## 2023-06-15 DIAGNOSIS — Z3A24 24 weeks gestation of pregnancy: Secondary | ICD-10-CM

## 2023-06-15 DIAGNOSIS — N200 Calculus of kidney: Secondary | ICD-10-CM

## 2023-06-15 DIAGNOSIS — N132 Hydronephrosis with renal and ureteral calculous obstruction: Principal | ICD-10-CM | POA: Diagnosis present

## 2023-06-15 DIAGNOSIS — M549 Dorsalgia, unspecified: Secondary | ICD-10-CM | POA: Diagnosis present

## 2023-06-15 DIAGNOSIS — Z87442 Personal history of urinary calculi: Secondary | ICD-10-CM

## 2023-06-15 DIAGNOSIS — Z148 Genetic carrier of other disease: Secondary | ICD-10-CM | POA: Diagnosis not present

## 2023-06-15 DIAGNOSIS — B9620 Unspecified Escherichia coli [E. coli] as the cause of diseases classified elsewhere: Secondary | ICD-10-CM | POA: Diagnosis present

## 2023-06-15 DIAGNOSIS — N1330 Unspecified hydronephrosis: Secondary | ICD-10-CM | POA: Diagnosis present

## 2023-06-15 DIAGNOSIS — O09522 Supervision of elderly multigravida, second trimester: Secondary | ICD-10-CM

## 2023-06-15 DIAGNOSIS — O0990 Supervision of high risk pregnancy, unspecified, unspecified trimester: Secondary | ICD-10-CM

## 2023-06-15 DIAGNOSIS — N390 Urinary tract infection, site not specified: Secondary | ICD-10-CM | POA: Diagnosis present

## 2023-06-15 HISTORY — PX: IR NEPHROSTOMY PLACEMENT RIGHT: IMG6064

## 2023-06-15 LAB — URINALYSIS, ROUTINE W REFLEX MICROSCOPIC
Bacteria, UA: NONE SEEN
Bacteria, UA: NONE SEEN
Bilirubin Urine: NEGATIVE
Bilirubin Urine: NEGATIVE
Glucose, UA: NEGATIVE mg/dL
Glucose, UA: NEGATIVE mg/dL
Ketones, ur: 20 mg/dL — AB
Ketones, ur: NEGATIVE mg/dL
Nitrite: NEGATIVE
Nitrite: NEGATIVE
Protein, ur: 30 mg/dL — AB
Protein, ur: 100 mg/dL — AB
RBC / HPF: >50 RBC/hpf (ref 0–5)
Specific Gravity, Urine: 1.02 (ref 1.005–1.030)
Specific Gravity, Urine: 1.026 (ref 1.005–1.030)
WBC, UA: >50 WBC/hpf (ref 0–5)
WBC, UA: >50 WBC/hpf (ref 0–5)
pH: 5 (ref 5.0–8.0)
pH: 6 (ref 5.0–8.0)

## 2023-06-15 LAB — CBC WITH DIFFERENTIAL/PLATELET
Abs Immature Granulocytes: 0.08 10*3/uL — ABNORMAL HIGH (ref 0.00–0.07)
Basophils Absolute: 0 10*3/uL (ref 0.0–0.1)
Basophils Relative: 0 %
Eosinophils Absolute: 0 10*3/uL (ref 0.0–0.5)
Eosinophils Relative: 0 %
HCT: 29.7 % — ABNORMAL LOW (ref 36.0–46.0)
Hemoglobin: 9.8 g/dL — ABNORMAL LOW (ref 12.0–15.0)
Immature Granulocytes: 1 %
Lymphocytes Relative: 3 %
Lymphs Abs: 0.5 10*3/uL — ABNORMAL LOW (ref 0.7–4.0)
MCH: 28.3 pg (ref 26.0–34.0)
MCHC: 33 g/dL (ref 30.0–36.0)
MCV: 85.8 fL (ref 80.0–100.0)
Monocytes Absolute: 1 10*3/uL (ref 0.1–1.0)
Monocytes Relative: 6 %
Neutro Abs: 13.6 10*3/uL — ABNORMAL HIGH (ref 1.7–7.7)
Neutrophils Relative %: 90 %
Platelets: 267 10*3/uL (ref 150–400)
RBC: 3.46 MIL/uL — ABNORMAL LOW (ref 3.87–5.11)
RDW: 14 % (ref 11.5–15.5)
WBC: 15.2 10*3/uL — ABNORMAL HIGH (ref 4.0–10.5)
nRBC: 0 % (ref 0.0–0.2)

## 2023-06-15 LAB — PROTIME-INR
INR: 1 (ref 0.8–1.2)
Prothrombin Time: 13.8 seconds (ref 11.4–15.2)

## 2023-06-15 LAB — COMPREHENSIVE METABOLIC PANEL
ALT: 11 U/L (ref 0–44)
AST: 17 U/L (ref 15–41)
Albumin: 2.7 g/dL — ABNORMAL LOW (ref 3.5–5.0)
Alkaline Phosphatase: 68 U/L (ref 38–126)
Anion gap: 8 (ref 5–15)
BUN: 11 mg/dL (ref 6–20)
CO2: 19 mmol/L — ABNORMAL LOW (ref 22–32)
Calcium: 8.4 mg/dL — ABNORMAL LOW (ref 8.9–10.3)
Chloride: 105 mmol/L (ref 98–111)
Creatinine, Ser: 0.7 mg/dL (ref 0.44–1.00)
GFR, Estimated: >60 mL/min (ref >60)
Glucose, Bld: 102 mg/dL — ABNORMAL HIGH (ref 70–99)
Potassium: 3.1 mmol/L — ABNORMAL LOW (ref 3.5–5.1)
Sodium: 132 mmol/L — ABNORMAL LOW (ref 135–145)
Total Bilirubin: 0.4 mg/dL (ref 0.3–1.2)
Total Protein: 6.1 g/dL — ABNORMAL LOW (ref 6.5–8.1)

## 2023-06-15 LAB — TYPE AND SCREEN
ABO/RH(D): O POS
Antibody Screen: NEGATIVE

## 2023-06-15 MED ORDER — MIDAZOLAM HCL 2 MG/2ML IJ SOLN
INTRAMUSCULAR | Status: AC | PRN
  Administered 2023-06-15 (×2): .5 mg via INTRAVENOUS

## 2023-06-15 MED ORDER — MORPHINE SULFATE (PF) 2 MG/ML IV SOLN: 2.0000 mg | Freq: Four times a day (QID) | INTRAVENOUS | Status: DC

## 2023-06-15 MED ORDER — CALCIUM CARBONATE ANTACID 500 MG PO CHEW: 2.0000 | CHEWABLE_TABLET | ORAL | Status: DC | PRN

## 2023-06-15 MED ORDER — ONDANSETRON HCL 4 MG/2ML IJ SOLN
INTRAMUSCULAR | Status: AC
  Filled 2023-06-15: qty 2

## 2023-06-15 MED ORDER — PRENATAL MULTIVITAMIN CH
1.0000 | ORAL_TABLET | Freq: Every day | ORAL | Status: DC
  Administered 2023-06-15 – 2023-06-17 (×3): 1 via ORAL
  Filled 2023-06-15 (×3): qty 1

## 2023-06-15 MED ORDER — SODIUM CHLORIDE 0.9% FLUSH
5.0000 mL | Freq: Three times a day (TID) | INTRAVENOUS | Status: DC
  Administered 2023-06-15 – 2023-06-17 (×6): 5 mL

## 2023-06-15 MED ORDER — CEPHALEXIN 500 MG PO CAPS
500.0000 mg | ORAL_CAPSULE | Freq: Four times a day (QID) | ORAL | Status: DC
  Administered 2023-06-15 – 2023-06-17 (×8): 500 mg via ORAL
  Filled 2023-06-15 (×8): qty 1

## 2023-06-15 MED ORDER — CEPHALEXIN 500 MG PO CAPS
500.0000 mg | ORAL_CAPSULE | Freq: Every day | ORAL | Status: DC
  Administered 2023-06-15: 500 mg via ORAL
  Filled 2023-06-15: qty 1

## 2023-06-15 MED ORDER — ONDANSETRON 4 MG PO TBDP
8.0000 mg | ORAL_TABLET | Freq: Once | ORAL | Status: AC
  Administered 2023-06-15: 8 mg via ORAL
  Filled 2023-06-15: qty 2

## 2023-06-15 MED ORDER — LACTATED RINGERS IV SOLN: 125.0000 mL/h | INTRAVENOUS | Status: DC

## 2023-06-15 MED ORDER — IOHEXOL 300 MG/ML  SOLN
50.0000 mL | Freq: Once | INTRAMUSCULAR | Status: AC | PRN
  Administered 2023-06-15: 10 mL

## 2023-06-15 MED ORDER — FENTANYL CITRATE (PF) 100 MCG/2ML IJ SOLN
INTRAMUSCULAR | Status: AC | PRN
  Administered 2023-06-15 (×2): 25 ug via INTRAVENOUS

## 2023-06-15 MED ORDER — ACETAMINOPHEN 500 MG PO TABS
1000.0000 mg | ORAL_TABLET | ORAL | Status: DC | PRN
  Administered 2023-06-15 – 2023-06-17 (×4): 1000 mg via ORAL
  Filled 2023-06-15 (×4): qty 2

## 2023-06-15 MED ORDER — LIDOCAINE HCL 1 % IJ SOLN
INTRAMUSCULAR | Status: AC
  Filled 2023-06-15: qty 20

## 2023-06-15 MED ORDER — MORPHINE SULFATE (PF) 2 MG/ML IV SOLN
2.0000 mg | Freq: Four times a day (QID) | INTRAVENOUS | Status: DC | PRN
  Administered 2023-06-15: 2 mg via SUBCUTANEOUS
  Filled 2023-06-15: qty 1

## 2023-06-15 MED ORDER — SODIUM CHLORIDE 0.9 % IV SOLN: 2.0000 g | INTRAVENOUS | Status: AC

## 2023-06-15 MED ORDER — LACTATED RINGERS IV SOLN: INTRAVENOUS | Status: DC

## 2023-06-15 MED ORDER — MORPHINE SULFATE (PF) 4 MG/ML IV SOLN
4.0000 mg | Freq: Once | INTRAVENOUS | Status: AC
  Administered 2023-06-15: 4 mg via INTRAMUSCULAR
  Filled 2023-06-15: qty 1

## 2023-06-15 MED ORDER — LACTATED RINGERS IV SOLN: 125.0000 mL/h | INTRAVENOUS | Status: AC

## 2023-06-15 MED ORDER — LIDOCAINE HCL 1 % IJ SOLN
20.0000 mL | Freq: Once | INTRAMUSCULAR | Status: AC
  Administered 2023-06-15: 20 mL via INTRADERMAL

## 2023-06-15 MED ORDER — DOCUSATE SODIUM 100 MG PO CAPS
100.0000 mg | ORAL_CAPSULE | Freq: Every day | ORAL | Status: DC
  Administered 2023-06-16 – 2023-06-17 (×2): 100 mg via ORAL
  Filled 2023-06-15 (×2): qty 1

## 2023-06-15 MED ORDER — MIDAZOLAM HCL 2 MG/2ML IJ SOLN
INTRAMUSCULAR | Status: AC
  Filled 2023-06-15: qty 2

## 2023-06-15 MED ORDER — FENTANYL CITRATE (PF) 100 MCG/2ML IJ SOLN
INTRAMUSCULAR | Status: AC
  Filled 2023-06-15: qty 2

## 2023-06-15 MED ORDER — ONDANSETRON 4 MG PO TBDP
4.0000 mg | ORAL_TABLET | Freq: Three times a day (TID) | ORAL | Status: DC | PRN
  Administered 2023-06-15: 4 mg via ORAL
  Filled 2023-06-15: qty 1

## 2023-06-15 NOTE — MAU Note (Signed)
.  Amber Daugherty is a 37 y.o. at Unknown here in MAU reporting: on 5th round of antibiotics for UTI-Monday started passing "fragments"-Wednesday [place]-reports multiple stones on R and L kidney. Pain began at 1930 called midwife and Flomax and Percocet called in. Pt reports taking Percocet at 0350. R flank pain Endorses + fetal movement Denies vaginal bleeding or bloody show. Denies painful contractions or uterine cramping.  Onset of complaint: 1930 Pain score: 2 R side Vitals:   06/15/23 0518  BP: 119/68  Pulse: 93  Resp: 16  Temp: 97.7 F (36.5 C)  SpO2: 100%     FHT:157bpm :

## 2023-06-15 NOTE — MAU Provider Note (Signed)
History     CSN: [id number]  Arrival date and time: 06/15/23 0456   Event Date/Time   First Provider Initiated Contact with Patient 06/15/23 [phone]      No chief complaint on file.  Amber Daugherty is a 37 y.o. G3P1001 at [redacted]w[redacted]d who receives care at [facility] under care of D. Renae Fickle, CNM.  She presents today for pain in setting of known kidney stones.  She states she has had an ongoing UTI since March and is now on suppressive therapy.  She states Monday she passed "what looked like a kidney stone fragment."  She reports she was able to get into urologist and had [place] on Wednesday that showed mild hydronephrosis and multiple right side stones with largest 1.9cm.  She states today she started having pain about 12 hours prior to arrival and contacted her provider. She was prescribed Flomax and Percocet, but did not take the percocet until 0350 d/t nausea.  She states her pain is currently a 2-3/10.  She states her pain is currently in her low back (right side) and radiates to her right flank area. She also reports she passed 2 stones prior to leaving her house. (Patient has 2 small stones in plastic bag).    Review of [facility] prenatal records. Significant findings: Multiple UTIs during pregnancy, currently on Keflex 500mg  daily for suppression   [facility] Results Obtained from Care Everywhere:    US Renal Bilateral-Complete (06/11/2023 12:58 PM EDT) Impressions  06/11/2023 1:04 PM EDT  1. Moderate right-sided hydronephrosis. 2. Multiple right renal calculi, largest measuring up to 1.9 cm. Possible small left renal calculi. 3. Possible mild debris in the bladder lumen. 4. Bilateral ureteral jets in the bladder, implying ureteral patency. Given the hydronephrosis, a nonvisualized ureteral calculus cannot be excluded. Consider abdominopelvic CT for further evaluation.     OB History     Gravida  3   Para  1   Term  1   Preterm      AB      Living  1      SAB       IAB      Ectopic      Multiple  0   Live Births  1           Past Medical History:  Diagnosis Date   Irritable bowel syndrome (IBS)    Mitral valve prolapse    Pt reported     Past Surgical History:  Procedure Laterality Date   ADENOIDECTOMY  1989   LAPAROSCOPY  07/2017    No family history on file.  Social History   Tobacco Use   Smoking status: Never   Smokeless tobacco: Never  Vaping Use   Vaping Use: Never used  Substance Use Topics   Alcohol use: Never   Drug use: Never    Allergies:  Allergies  Allergen Reactions   Amoxicillin    Ketoconazole    Sulfa Antibiotics     Medications Prior to Admission  Medication Sig Dispense Refill Last Dose   Prenatal Vit-Fe Fumarate-FA (PRENATAL MULTIVITAMIN) TABS tablet Take 1 tablet by mouth daily at 12 noon.       Review of Systems  Gastrointestinal:  Negative for abdominal pain, nausea and vomiting.  Genitourinary:  Positive for flank pain, frequency (With minimal output) and pelvic pain (Pressure). Negative for difficulty urinating, dysuria, vaginal bleeding and vaginal discharge.   Physical Exam   Blood pressure 119/68, pulse 93, temperature 97.7  F (36.5 C), temperature source Oral, resp. rate 16, height 5\' 6"  (1.676 m), weight 76.1 kg, SpO2 100 %, unknown if currently breastfeeding.  Physical Exam Vitals reviewed.  Constitutional:      General: She is not in acute distress.    Appearance: Normal appearance. She is not toxic-appearing.  HENT:     Head: Normocephalic and atraumatic.  Eyes:     Conjunctiva/sclera: Conjunctivae normal.  Cardiovascular:     Rate and Rhythm: Normal rate.  Pulmonary:     Effort: Pulmonary effort is normal. No respiratory distress.  Abdominal:     Palpations: Abdomen is soft.     Tenderness: There is no abdominal tenderness. There is no right CVA tenderness or left CVA tenderness.     Comments: Gravid, Soft, Appears AGA  Musculoskeletal:        General: Normal range  of motion.     Cervical back: Normal range of motion.  Skin:    General: Skin is warm and dry.  Neurological:     Mental Status: She is alert and oriented to person, place, and time.  Psychiatric:        Mood and Affect: Mood normal.        Behavior: Behavior normal.     Fetal Assessment 150 bpm, Mod Var, -Decels, +Accels Toco: No ctx graphed  MAU Course  No results found for this or any previous visit (from the past 24 hour(s)). US RENAL  Result Date: 06/15/2023 CLINICAL DATA:  37 year old pregnant female with right flank pain. She reports multiple rounds of antibiotics recently for UTI and also passing urinary "fragments". EXAM: RENAL / URINARY TRACT ULTRASOUND COMPLETE COMPARISON:  [facility] renal ultrasound 06/11/2023. FINDINGS: Right Kidney: Renal measurements: 14.4 x 7.2 x 7.7 cm = volume: 413 mL mL. Moderate to severe right hydronephrosis (series 1, image 3) not significantly changed from 06/11/2023 (series 1, image 8 of that exam). Shadowing roughly 3 cm object in the right renal pelvis suspicious for a large obstructing stone. The renal pelvis was clear on 06/11/2023, but other large stones were visible in the right kidney at that time. And at least 1 additional upper pole stone is visible now (up to 1.6 cm). Additionally there is a small volume of pararenal space fluid (series 3). Left Kidney: Renal measurements: 12.2 x 6.5 x 6.2 cm = volume: 259 mL. Echogenicity within normal limits. No mass or hydronephrosis visualized. No left nephrolithiasis identified by ultrasound. Bladder: Decompressed.  Gravid uterus partially visible. IMPRESSION: 1. Moderate to Severe Right Hydronephrosis appears related to a large obstructing UPJ calculus (estimated at 2-3 cm). Evidence of forniceal rupture. Multiple additional right renal stones. 2. Nonobstructed left kidney with a negative ultrasound appearance. Electronically Signed   By: Odessa Fleming M.D.   On:  06/15/2023 06:56    MDM PE Labs:CBC/D, UA, CMP, T&S EFM Ultrasound Consult Assessment and Plan  37 year old G3P1001  SIUP at 24.6 weeks Kidney Stones  -Exam performed.  -Patient without current pain or nausea. -Discussed sending for repeat US.  Cherre Robins MSN, CNM 06/15/2023, 5:09 AM   Reassessment (6:41 AM) -Patient reporting increased pain from 2/10 to 5-6/10. Also with nausea and vomiting. -Provider to bedside. -Patient appears to be in acute distress, appears pale, diaphoretic. -Give zofran 8mg  ODT now.  -Patient requests ginger ale. -Patient okay with IM injection. -Give morphine 4mg . -Awaiting [place] results.   Reassessment (7:21 AM) -Results as above. -U. Macon Large, MD  consulted and informed of patient status, evaluation, interventions, and results. Advised: *Patient needs admission and urology consult. *Obtain labs: CBC/D, CMP, UA, T&S. *Notify private provider for assumption of care. -Dr. Ilda Mori contacted and states midwife should be contacted. -D. Renae Fickle, CNM contacted and informed of patient status.   -Basic admission orders placed. -Patient updated on POC.  Cherre Robins MSN, CNM Advanced Practice Provider, [facility] [employer]

## 2023-06-15 NOTE — Procedures (Signed)
Vascular and Interventional Radiology Procedure Note  Patient: Radene Limone DOB: Aug 28, 1986 Medical Record Number: [id number] Note Date/Time: 06/15/23 12:36 PM   Performing Physician: Roanna Banning, MD Assistant(s): None  Diagnosis: Gravid Pt with R emphysematous pyelonephritis  Procedure:  RIGHT NEPHROSTOMY TUBE PLACEMENT  Anesthesia: Conscious Sedation Complications: None Estimated Blood Loss: Minimal Specimens:  None  Findings:  Successful placement of right-sided, 10 F nephrostomy tube into the right kidney(s).   Plan: Flush PCN w 5 mL and record drain output qShift. Follow up for routine nephrostomy tube exchange in 6 week(s).   See detailed procedure note with images in PACS. The patient tolerated the procedure well without incident or complication and was returned to Recovery in stable condition.    Roanna Banning, MD Vascular and Interventional Radiology [facility]   Pager. [phone] Clinic. [phone]

## 2023-06-15 NOTE — Consult Note (Signed)
Chief Complaint: [name]  Patient presents with   Nephrolithiasis    R and L side   at the request of Sebastian Ache   Referring Physician(s): Sebastian Ache   Supervising Physician: Roanna Banning  Patient Status: [facility] - In-pt  History of Present Illness: Amber Daugherty is a 37 y.o. female with PMHs of MVP, IBS, G3P1001 at 27 w6d, UTI, right obstructing nephrolithiasis and right hydronephrosis who was referred to IR for right PCN placement by urology.   Patient had several e coli UTI during this pregnancy and currently on Keflex 500mg  daily for suppression, she presented to MAU this morning due to back and nausea, labs showed leukocytosis 15.2, anemia 9.8, normal RF, UA with leukocytes, protein, ketone, and small hgb. CT AP w/o showed:  1. Moderate right-sided hydronephrosis with associated minimal air within the intrarenal collecting system and dilated right renal pelvis. Air in the collecting system could be due to recent instrumentation versus infection/emphysematous pyelitis. Multiple large stones within the right intrarenal collecting system with largest stone over the right upper pole measuring 2.2 cm. 3-4 small stones together at the right UVJ with the largest measuring 6 mm. 2. Couple punctate nonobstructing stones over the mid to lower pole of the left kidney. 3. 8 mm hypodensity over the left arm of the liver too small to characterize, but likely a cyst or hemangioma. 4. Enlarged gravid uterus with fetus in cephalic presentation.  Patient was referred to urology and R PCN placement was recommended for decompression, urology planning for percutaneous stone surgery after delivery.   IR was requested for image guided R PCN placement, case reviewed and approved by Dr. Milford Cage.   Patient laying in bed, not in acute distress.  Reports right back/flank pain and little bit of nausea.  Denise  headache, fever, chills, shortness of breath, cough, chest pain, vomiting, and bleeding.  Risks and benefit of R PCN placement as well as possible radiation exposure during the procedure were discussed with the patient, and after thorough discussion and shared decision making she decided to proceed with the  R PCN placement under the moderate sedation.     Past Medical History:  Diagnosis Date   Irritable bowel syndrome (IBS)    Mitral valve prolapse    Pt reported     Past Surgical History:  Procedure Laterality Date   ADENOIDECTOMY  1989   LAPAROSCOPY  07/2017    Allergies: Amoxicillin, Ketoconazole, and Sulfa antibiotics  Medications: Prior to Admission medications   Medication Sig Start Date End Date Taking? Authorizing Provider  Prenatal Vit-Fe Fumarate-FA (PRENATAL MULTIVITAMIN) TABS tablet Take 1 tablet by mouth daily at 12 noon.    [provider]     History reviewed. No pertinent family history.  Social History   Socioeconomic History   Marital status: Married    Spouse name: Not on file   Number of children: Not on file   Years of education: Not on file   Highest education level: Not on file  Occupational History   Not on file  Tobacco Use   Smoking status: Never   Smokeless tobacco: Never  Vaping Use   Vaping Use: Never used  Substance and Sexual Activity   Alcohol use: Never   Drug use: Never   Sexual activity: Not on file  Other Topics Concern   Not on file  Social History Narrative   Not on file   Social Determinants  of Health   Financial Resource Strain: Not on file  Food Insecurity: No Food Insecurity (06/15/2023)   Hunger Vital Sign    Worried About Running Out of Food in the Last Year: Never true    Ran Out of Food in the Last Year: Never true  Transportation Needs: No Transportation Needs (06/15/2023)   PRAPARE - Administrator, Civil Service (Medical): No    Lack of Transportation (Non-Medical): No  Physical  Activity: Not on file  Stress: Not on file  Social Connections: Not on file     Review of Systems: A 12 point ROS discussed and pertinent positives are indicated in the HPI above.  All other systems are negative.  Vital Signs: BP 109/63 (BP Location: Left Arm)   Pulse 99   Temp 98.2 F (36.8 C) (Oral)   Resp 17   Ht 5\' 6"  (1.676 m)   Wt 167 lb 12.8 oz (76.1 kg)   SpO2 100%   BMI 27.08 kg/m    Physical Exam Vitals reviewed.  Constitutional:      General: She is not in acute distress.    Appearance: She is not ill-appearing.  HENT:     Head: Normocephalic.     Mouth/Throat:     Mouth: Mucous membranes are moist.     Pharynx: Oropharynx is clear.  Cardiovascular:     Rate and Rhythm: Normal rate and regular rhythm.     Heart sounds: Normal heart sounds.  Pulmonary:     Effort: Pulmonary effort is normal.     Breath sounds: Normal breath sounds.  Abdominal:     General: Bowel sounds are normal. There is distension.     Comments: Pt pregnant   Musculoskeletal:     Cervical back: Neck supple.  Skin:    General: Skin is warm and dry.     Coloration: Skin is not jaundiced or pale.  Neurological:     Mental Status: She is alert and oriented to person, place, and time.  Psychiatric:        Mood and Affect: Mood normal.        Behavior: Behavior normal.        Judgment: Judgment normal.     MD Evaluation Airway: WNL Heart: WNL Abdomen: WNL Chest/ Lungs: WNL ASA  Classification: 3 Mallampati/Airway Score: Two  Imaging: CT ABDOMEN PELVIS WO CONTRAST  Addendum Date: 06/15/2023   ADDENDUM REPORT: 06/15/2023 10:05 ADDENDUM: These results were called by telephone at the time of interpretation on 06/15/2023 at 9:33 a.m. to provider Arlan Organ , who verbally acknowledged these results. Dr. 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 Electronically Signed   By: Elberta Fortis M.D.   On: 06/15/2023 10:05   Result Date: 06/15/2023 CLINICAL DATA:  Patient 24 +weeks pregnant. Urolithiasis with multiple stones seen on recent ultrasound. EXAM: CT ABDOMEN AND PELVIS WITHOUT CONTRAST TECHNIQUE: Multidetector CT imaging of the abdomen and pelvis was performed following the standard protocol without IV contrast. RADIATION DOSE REDUCTION: This exam was performed according to the departmental dose-optimization program which includes automated exposure control, adjustment of the mA and/or kV according to patient size and/or use of iterative reconstruction technique. COMPARISON:  Ultrasound 06/11/2023 and 06/15/2023 FINDINGS: Lower chest: Lung bases are clear.  Heart is normal size. Hepatobiliary: 8 mm hypodensity over the left arm of the liver too small to characterize but likely a cyst  or hemangioma. Gallbladder and biliary tree are normal. Pancreas: Normal. Spleen: Normal. Adrenals/Urinary Tract: Adrenal glands are normal. Kidneys are normal size. Couple punctate nonobstructing stones over the mid to lower pole of the left kidney. Moderate right-sided hydronephrosis with minimal air within the intrarenal collecting system and dilated right renal pelvis. Multiple large stones within the right intrarenal collecting system and layering along the dependent side of the dilated right renal pelvis. Largest stone measures 2.2 cm over the upper pole. Very minimal right perinephric fluid. Mild dilatation of the right ureter. 3-4 small stones all at the right UVJ with the largest measuring 6 mm left ureter and bladder are normal. Stomach/Bowel: Stomach and small bowel are normal. Appendix not visualized. Colon is normal. Vascular/Lymphatic: No significant vascular findings are present. No enlarged abdominal or pelvic lymph nodes. Reproductive: Evidence patient's enlarged gravid uterus with fetus in cephalic presentation. Adnexal regions unremarkable. Other: None. Musculoskeletal: No focal  abnormality. IMPRESSION: 1. Moderate right-sided hydronephrosis with associated minimal air within the intrarenal collecting system and dilated right renal pelvis. Air in the collecting system could be due to recent instrumentation versus infection/emphysematous pyelitis. Multiple large stones within the right intrarenal collecting system with largest stone over the right upper pole measuring 2.2 cm. 3-4 small stones together at the right UVJ with the largest measuring 6 mm. 2. Couple punctate nonobstructing stones over the mid to lower pole of the left kidney. 3. 8 mm hypodensity over the left arm of the liver too small to characterize, but likely a cyst or hemangioma. 4. Enlarged gravid uterus with fetus in cephalic presentation. Electronically Signed: By: Elberta Fortis M.D. On: 06/15/2023 09:20   US RENAL  Result Date: 06/15/2023 CLINICAL DATA:  37 year old pregnant female with right flank pain. She reports multiple rounds of antibiotics recently for UTI and also passing urinary "fragments". EXAM: RENAL / URINARY TRACT ULTRASOUND COMPLETE COMPARISON:  [facility] renal ultrasound 06/11/2023. FINDINGS: Right Kidney: Renal measurements: 14.4 x 7.2 x 7.7 cm = volume: 413 mL mL. Moderate to severe right hydronephrosis (series 1, image 3) not significantly changed from 06/11/2023 (series 1, image 8 of that exam). Shadowing roughly 3 cm object in the right renal pelvis suspicious for a large obstructing stone. The renal pelvis was clear on 06/11/2023, but other large stones were visible in the right kidney at that time. And at least 1 additional upper pole stone is visible now (up to 1.6 cm). Additionally there is a small volume of pararenal space fluid (series 3). Left Kidney: Renal measurements: 12.2 x 6.5 x 6.2 cm = volume: 259 mL. Echogenicity within normal limits. No mass or hydronephrosis visualized. No left nephrolithiasis identified by ultrasound. Bladder:  Decompressed.  Gravid uterus partially visible. IMPRESSION: 1. Moderate to Severe Right Hydronephrosis appears related to a large obstructing UPJ calculus (estimated at 2-3 cm). Evidence of forniceal rupture. Multiple additional right renal stones. 2. Nonobstructed left kidney with a negative ultrasound appearance. Electronically Signed   By: Odessa Fleming M.D.   On: 06/15/2023 06:56    Labs:  CBC: Recent Labs    06/15/23 0757  WBC 15.2*  HGB 9.8*  HCT 29.7*  PLT 267    COAGS: No results for input(s): "INR", "APTT" in the last 8760 hours.  BMP: Recent Labs    06/15/23 0757  NA 132*  K 3.1*  CL 105  CO2 19*  GLUCOSE 102*  BUN 11  CALCIUM 8.4*  CREATININE 0.70  GFRNONAA >60  LIVER FUNCTION TESTS: Recent Labs    06/15/23 0757  BILITOT 0.4  AST 17  ALT 11  ALKPHOS 68  PROT 6.1*  ALBUMIN 2.7*    TUMOR MARKERS: No results for input(s): "AFPTM", "CEA", "CA199", "CHROMGRNA" in the last 8760 hours.  Assessment and Plan: 37 y.o. currently 25 w6d pregnant  female with recurrent e coli UTI, right flank pain, found to have right obstructing nephrolithiasis with right hydronephrosis who presents for R PCN placement.   VSS CBC leukocytosis, anemia  RF wnl  On po Keflex 500 mg due to recurrent e coli UTI, discussed with Dr. Milford Cage. Patient to receive 2 g Rocephin during the PCN placement.  No INR in chart, OK to proceed without but will obtain one for baseline per Dr. Milford Cage.   Risks and benefits of right PCN placement was discussed with the patient including, but not limited to, infection, bleeding, significant bleeding causing loss or decrease in renal function or damage to adjacent structures.   All of the patient's questions were answered, patient is agreeable to proceed.  Consent signed and in IR.     Thank you for this interesting consult.  I greatly enjoyed meeting Amber Daugherty and look forward to participating in their care.  A copy of this report was sent to the  requesting provider on this date.  Electronically Signed: Willette Brace, PA-C 06/15/2023, 11:26 AM   I spent a total of 20 Minutes    in face to face in clinical consultation, greater than 50% of which was counseling/coordinating care for R PCN placement.   This chart was dictated using voice recognition software.  Despite best efforts to proofread,  errors can occur which can change the documentation meaning.

## 2023-06-15 NOTE — Consult Note (Signed)
Reason for Consult: Multifocal Large Right Renal / Ureteral Stones in 24wk Pregnancy, Recurrent Urinary Tract Infections  Referring Physician: Ivonne Daugherty CNM  Amber Daugherty is an 37 y.o. female.   HPI:   1 - Multifocal Large Right Renal / Ureteral Stones in 24wk Pregnancy - Large volume Rt renal stones (abtou 3cm total) + distal ureteral stones by CT on eval hydro, flank pain, recurrent UTI. Neph tube placed as temporizing measure.   2 - Recurrent Urinary Tract Infections - recurrent cystitis per report. No positive CX data for review and on cephalexin per GYN during pregnany. Large likely colonized rt stones as per above.   PMH sig for IVF x1, lap ablation endometriosis. NO CV disease / blood thinners.  Her PCP is Amber Dickens PA with [facility]. Her primary GYN is Amber Daugherty CNM.  Today " Amber Daugherty " is seen as urgent consult for above.   Past Medical History:  Diagnosis Date   Irritable bowel syndrome (IBS)    Mitral valve prolapse    Pt reported     Past Surgical History:  Procedure Laterality Date   ADENOIDECTOMY  1989   LAPAROSCOPY  07/2017    History reviewed. No pertinent family history.  Social History:  reports that she has never smoked. She has never used smokeless tobacco. She reports that she does not drink alcohol and does not use drugs.  Allergies:  Allergies  Allergen Reactions   Amoxicillin    Ketoconazole    Sulfa Antibiotics     Medications: I have reviewed the patient's current medications.  Results for orders placed or performed during the hospital encounter of 06/15/23 (from the past 48 hour(s))  Comprehensive metabolic panel     Status: Abnormal   Collection Time: 06/15/23  7:57 AM  Result Value Ref Range   Sodium 132 (L) 135 - 145 mmol/L   Potassium 3.1 (L) 3.5 - 5.1 mmol/L   Chloride 105 98 - 111 mmol/L   CO2 19 (L) 22 - 32 mmol/L   Glucose, Bld 102 (H) 70 - 99 mg/dL    Comment: Glucose reference range applies only to samples taken after  fasting for at least 8 hours.   BUN 11 6 - 20 mg/dL   Creatinine, Ser 1.61 0.44 - 1.00 mg/dL   Calcium 8.4 (L) 8.9 - 10.3 mg/dL   Total Protein 6.1 (L) 6.5 - 8.1 g/dL   Albumin 2.7 (L) 3.5 - 5.0 g/dL   AST 17 15 - 41 U/L   ALT 11 0 - 44 U/L   Alkaline Phosphatase 68 38 - 126 U/L   Total Bilirubin 0.4 0.3 - 1.2 mg/dL   GFR, Estimated >09 >60 mL/min    Comment: (NOTE) Calculated using the CKD-EPI Creatinine Equation (2021)    Anion gap 8 5 - 15    Comment: Performed at [facility], [address], [place], [place] [address]  CBC with Differential/Platelet     Status: Abnormal   Collection Time: 06/15/23  7:57 AM  Result Value Ref Range   WBC 15.2 (H) 4.0 - 10.5 K/uL   RBC 3.46 (L) 3.87 - 5.11 MIL/uL   Hemoglobin 9.8 (L) 12.0 - 15.0 g/dL   HCT 81.1 (L) 91.4 - 78.2 %   MCV 85.8 80.0 - 100.0 fL   MCH 28.3 26.0 - 34.0 pg   MCHC 33.0 30.0 - 36.0 g/dL   RDW 95.6 21.3 - 08.6 %   Platelets 267 150 - 400 K/uL  nRBC 0.0 0.0 - 0.2 %   Neutrophils Relative % 90 %   Neutro Abs 13.6 (H) 1.7 - 7.7 K/uL   Lymphocytes Relative 3 %   Lymphs Abs 0.5 (L) 0.7 - 4.0 K/uL   Monocytes Relative 6 %   Monocytes Absolute 1.0 0.1 - 1.0 K/uL   Eosinophils Relative 0 %   Eosinophils Absolute 0.0 0.0 - 0.5 K/uL   Basophils Relative 0 %   Basophils Absolute 0.0 0.0 - 0.1 K/uL   Immature Granulocytes 1 %   Abs Immature Granulocytes 0.08 (H) 0.00 - 0.07 K/uL    Comment: Performed at [facility], [address], [place], [place] [address]  Type and screen     Status: None   Collection Time: 06/15/23  7:57 AM  Result Value Ref Range   ABO/RH(D) O POS    Antibody Screen NEG    Sample Expiration      06/18/2023,2359 Performed at [facility], [address], [place], [place] [address]   Urinalysis, Routine w reflex microscopic -Urine, Clean Catch     Status: Abnormal   Collection Time: 06/15/23  8:00 AM  Result Value Ref Range   Color, Urine YELLOW YELLOW   APPearance HAZY (A)  CLEAR   Specific Gravity, Urine 1.020 1.005 - 1.030   pH 5.0 5.0 - 8.0   Glucose, UA NEGATIVE NEGATIVE mg/dL   Hgb urine dipstick SMALL (A) NEGATIVE   Bilirubin Urine NEGATIVE NEGATIVE   Ketones, ur 20 (A) NEGATIVE mg/dL   Protein, ur 30 (A) NEGATIVE mg/dL   Nitrite NEGATIVE NEGATIVE   Leukocytes,Ua LARGE (A) NEGATIVE   RBC / HPF 21-50 0 - 5 RBC/hpf   WBC, UA >50 0 - 5 WBC/hpf   Bacteria, UA NONE SEEN NONE SEEN   Squamous Epithelial / HPF 0-5 0 - 5 /HPF   Mucus PRESENT     Comment: Performed at [facility], [address] [place], [place] [address]    CT ABDOMEN PELVIS WO CONTRAST  Result Date: 06/15/2023 CLINICAL DATA:  Patient 24 +weeks pregnant. Urolithiasis with multiple stones seen on recent ultrasound. EXAM: CT ABDOMEN AND PELVIS WITHOUT CONTRAST TECHNIQUE: Multidetector CT imaging of the abdomen and pelvis was performed following the standard protocol without IV contrast. RADIATION DOSE REDUCTION: This exam was performed according to the departmental dose-optimization program which includes automated exposure control, adjustment of the mA and/or kV according to patient size and/or use of iterative reconstruction technique. COMPARISON:  Ultrasound 06/11/2023 and 06/15/2023 FINDINGS: Lower chest: Lung bases are clear.  Heart is normal size. Hepatobiliary: 8 mm hypodensity over the left arm of the liver too small to characterize but likely a cyst or hemangioma. Gallbladder and biliary tree are normal. Pancreas: Normal. Spleen: Normal. Adrenals/Urinary Tract: Adrenal glands are normal. Kidneys are normal size. Couple punctate nonobstructing stones over the mid to lower pole of the left kidney. Moderate right-sided hydronephrosis with minimal air within the intrarenal collecting system and dilated right renal pelvis. Multiple large stones within the right intrarenal collecting system and layering along the dependent side of the dilated right renal pelvis. Largest stone measures 2.2 cm  over the upper pole. Very minimal right perinephric fluid. Mild dilatation of the right ureter. 3-4 small stones all at the right UVJ with the largest measuring 6 mm left ureter and bladder are normal. Stomach/Bowel: Stomach and small bowel are normal. Appendix not visualized. Colon is normal. Vascular/Lymphatic: No significant vascular findings are present. No  enlarged abdominal or pelvic lymph nodes. Reproductive: Evidence patient's enlarged gravid uterus with fetus in cephalic presentation. Adnexal regions unremarkable. Other: None. Musculoskeletal: No focal abnormality. IMPRESSION: 1. Moderate right-sided hydronephrosis with associated minimal air within the intrarenal collecting system and dilated right renal pelvis. Air in the collecting system could be due to recent instrumentation versus infection/emphysematous pyelitis. Multiple large stones within the right intrarenal collecting system with largest stone over the right upper pole measuring 2.2 cm. 3-4 small stones together at the right UVJ with the largest measuring 6 mm. 2. Couple punctate nonobstructing stones over the mid to lower pole of the left kidney. 3. 8 mm hypodensity over the left arm of the liver too small to characterize, but likely a cyst or hemangioma. 4. Enlarged gravid uterus with fetus in cephalic presentation. Electronically Signed   By: Elberta Fortis M.D.   On: 06/15/2023 09:20   US RENAL  Result Date: 06/15/2023 CLINICAL DATA:  37 year old pregnant female with right flank pain. She reports multiple rounds of antibiotics recently for UTI and also passing urinary "fragments". EXAM: RENAL / URINARY TRACT ULTRASOUND COMPLETE COMPARISON:  [facility] [place] [place] renal ultrasound 06/11/2023. FINDINGS: Right Kidney: Renal measurements: 14.4 x 7.2 x 7.7 cm = volume: 413 mL mL. Moderate to severe right hydronephrosis (series 1, image 3) not significantly changed from 06/11/2023 (series 1, image 8 of that exam).  Shadowing roughly 3 cm object in the right renal pelvis suspicious for a large obstructing stone. The renal pelvis was clear on 06/11/2023, but other large stones were visible in the right kidney at that time. And at least 1 additional upper pole stone is visible now (up to 1.6 cm). Additionally there is a small volume of pararenal space fluid (series 3). Left Kidney: Renal measurements: 12.2 x 6.5 x 6.2 cm = volume: 259 mL. Echogenicity within normal limits. No mass or hydronephrosis visualized. No left nephrolithiasis identified by ultrasound. Bladder: Decompressed.  Gravid uterus partially visible. IMPRESSION: 1. Moderate to Severe Right Hydronephrosis appears related to a large obstructing UPJ calculus (estimated at 2-3 cm). Evidence of forniceal rupture. Multiple additional right renal stones. 2. Nonobstructed left kidney with a negative ultrasound appearance. Electronically Signed   By: Odessa Fleming M.D.   On: 06/15/2023 06:56    Review of Systems  Constitutional: Negative.  Negative for chills and fever.  HENT: Negative.    Eyes: Negative.   Respiratory: Negative.    Cardiovascular: Negative.   Gastrointestinal: Negative.   Genitourinary:  Positive for dysuria and flank pain.  Allergic/Immunologic: Negative.   Neurological: Negative.   Hematological: Negative.   Psychiatric/Behavioral: Negative.     Blood pressure 109/63, pulse 99, temperature 98.2 F (36.8 C), temperature source Oral, resp. rate 17, height 5\' 6"  (1.676 m), weight 76.1 kg, SpO2 100 %, unknown if currently breastfeeding. Physical Exam Vitals reviewed.  Constitutional:      Comments: Very pleasant, NAD, husband at bedside, they are an adorable couple.   HENT:     Head: Normocephalic.  Eyes:     Pupils: Pupils are equal, round, and reactive to light.  Cardiovascular:     Rate and Rhythm: Normal rate.  Pulmonary:     Effort: Pulmonary effort is normal.  Abdominal:     General: Abdomen is flat.     Comments: Gravid uterus  to jsut above umbilicus.   Genitourinary:    Comments: Rt neph tube in place with non-foul urine. No hematomas / ecchymoses.  Musculoskeletal:  General: Normal range of motion.     Cervical back: Normal range of motion.  Skin:    General: Skin is warm.  Neurological:     Mental Status: She is alert.     Assessment/Plan:  Rec IR neph tube for Rt renal decompression and continue during pregnancy changes Q4-6 weeks as safest. She will ultimately need Rt percutaneous nephrostolithotomy, likely staged, as definitive stone management after delivery. Do not favor stenting given multifocality and fact she will need percutnaeous access anyway at some point. Competing risks to mother and baby frankly discussed and that this is likely safest for both and offers highest chances of avoiding dangerous obstructing pyelo or severe colic in pregnancy.  Agree with low dose cephalexin proph during remainder of pregnancy.   Pt an husband have good understanding of plan and desire to proceed.  I feel she is OK for DC home tomorrow as long as remains afebrile overnight.   Please call me directly with questions anytime.     Loletta Parish. 06/15/2023, 9:58 AM

## 2023-06-15 NOTE — H&P (Addendum)
History of Present Illness: Amber Daugherty is a 37 y.o. G3P1001 at [redacted]w[redacted]d admitted for back pain and known multiple urolithiasis.   MAU eval done, large obstructed R UPJ with severe hydronephrosis, some pararenal space fluid suggestive of ruptured calyces fornix. This is a change from renal sono done 06/11/23.   Patient started taking Flomax yesterday after call to provider reporting pain in her back and nausea, denied fever or chills at that time. Overnight she took prescribed Percocet 5 mg x 1 and additional Tylenol 1 gm, pain did not improve and she came to MAU for eval. Denies VB/LOF/ctx, reports + FM. Spouse Tresa Endo present and supportive Received Morphine 4 mg IV for pain and Zofran 8 mg ODT in MAU and has good pain relief at present.   Risks: Multiple ecoli UTIs during pregnancy, currently on Keflex 500mg  daily for suppression, s/p urology consult outpatient with [facility] / Dr. Matt Holmes.  Hx MVP, maternal echo w/ prior pregnancy benign     [facility] Results Obtained from Care Everywhere:     US Renal Bilateral-Complete (06/11/2023 12:58 PM EDT) Impressions  06/11/2023 1:04 PM EDT  1. Moderate right-sided hydronephrosis. 2. Multiple right renal calculi, largest measuring up to 1.9 cm. Possible small left renal calculi. 3. Possible mild debris in the bladder lumen. 4. Bilateral ureteral jets in the bladder, implying ureteral patency. Given the hydronephrosis, a nonvisualized ureteral calculus cannot be excluded. Consider abdominopelvic CT for further evaluation.      Prenatal Labs: ABO, Rh:   O pos Antibody: neg Rubella:   imm RPR:   neg HBsAg:   neg HIV:   neg GBS:   neg 1 hr Glucola : pending at 28 wks Genetic Screening: NIPS LR XY  Patient Active Problem List   Diagnosis Date Noted   Alpha thalassemia trait 07/04/2020   Indication for care in labor or delivery 05/18/2019   Pregnancy 05/18/2019   Allergic rhinitis 09/16/2016   Attention deficit  hyperactivity disorder (ADHD) 09/16/2016    Past Medical History:  Diagnosis Date   Irritable bowel syndrome (IBS)    Mitral valve prolapse    Pt reported     Past Surgical History:  Procedure Laterality Date   ADENOIDECTOMY  1989   LAPAROSCOPY  07/2017    OB History  Gravida Para Term Preterm AB Living  3 1 1     1   SAB IAB Ectopic Multiple Live Births        0 1    # Outcome Date GA Lbr Len/2nd Weight Sex Delivery Anes PTL Lv  3 Current           2 Term 05/18/19 [redacted]w[redacted]d 09:12 / 01:08 3331 g F Vag-Spont EPI, Local  LIV  1 Gravida             Social History   Socioeconomic History   Marital status: Married    Spouse name: Not on file   Number of children: Not on file   Years of education: Not on file   Highest education level: Not on file  Occupational History   Not on file  Tobacco Use   Smoking status: Never   Smokeless tobacco: Never  Vaping Use   Vaping Use: Never used  Substance and Sexual Activity   Alcohol use: Never   Drug use: Never   Sexual activity: Not on file  Other Topics Concern   Not on file  Social History Narrative   Not on file   Social  Determinants of Health   Financial Resource Strain: Not on file  Food Insecurity: Not on file  Transportation Needs: Not on file  Physical Activity: Not on file  Stress: Not on file  Social Connections: Not on file    History reviewed. No pertinent family history.  Allergies  Allergen Reactions   Amoxicillin    Ketoconazole    Sulfa Antibiotics     Medications Prior to Admission  Medication Sig Dispense Refill Last Dose   Prenatal Vit-Fe Fumarate-FA (PRENATAL MULTIVITAMIN) TABS tablet Take 1 tablet by mouth daily at 12 noon.       Review of Systems - negative except R flank tenderness, nausea, dysuria  Vitals:  BP 119/71   Pulse (!) 109   Temp 97.7 F (36.5 C) (Oral)   Resp 16   Ht 5\' 6"  (1.676 m)   Wt 76.1 kg   SpO2 100%   BMI 27.08 kg/m  Physical Examination: CONSTITUTIONAL:  Well-developed, well-nourished female in no acute distress.  HENT:  Normocephalic, atraumatic, External right and left ear normal. Oropharynx is clear and moist EYES: Conjunctivae and EOM are normal. Pupils are equal, round, and reactive to light. No scleral icterus.  NECK: Normal range of motion, supple, no masses SKIN: Skin is warm and dry. No rash noted. Not diaphoretic. No erythema. No pallor. NEUROLGIC: Alert and oriented to person, place, and time. Normal reflexes, muscle tone coordination. No cranial nerve deficit noted. PSYCHIATRIC: Normal mood and affect. Normal behavior. Normal judgment and thought content. CARDIOVASCULAR: Normal heart rate noted, regular rhythm RESPIRATORY: Effort and breath sounds normal, no problems with respiration noted ABDOMEN: Soft, nontender, nondistended, gravid. CVAT: mild tenderness on R flank MUSCULOSKELETAL: Normal range of motion. No edema and no tenderness. 2+ distal pulses.  Cervix: Not evaluated  Fetal Monitoring:Baseline: 150 bpm, Variability: Good {> 6 bpm), Accelerations: Non-reactive but appropriate for gestational age, and Decelerations: Absent Tocometer: Flat  Labs:  Results for orders placed or performed during the hospital encounter of 06/15/23 (from the past 24 hour(s))  CBC with Differential/Platelet   Collection Time: 06/15/23  7:57 AM  Result Value Ref Range   WBC 15.2 (H) 4.0 - 10.5 K/uL   RBC 3.46 (L) 3.87 - 5.11 MIL/uL   Hemoglobin 9.8 (L) 12.0 - 15.0 g/dL   HCT 96.0 (L) 45.4 - 09.8 %   MCV 85.8 80.0 - 100.0 fL   MCH 28.3 26.0 - 34.0 pg   MCHC 33.0 30.0 - 36.0 g/dL   RDW 11.9 14.7 - 82.9 %   Platelets 267 150 - 400 K/uL   nRBC 0.0 0.0 - 0.2 %   Neutrophils Relative % 90 %   Neutro Abs 13.6 (H) 1.7 - 7.7 K/uL   Lymphocytes Relative 3 %   Lymphs Abs 0.5 (L) 0.7 - 4.0 K/uL   Monocytes Relative 6 %   Monocytes Absolute 1.0 0.1 - 1.0 K/uL   Eosinophils Relative 0 %   Eosinophils Absolute 0.0 0.0 - 0.5 K/uL   Basophils  Relative 0 %   Basophils Absolute 0.0 0.0 - 0.1 K/uL   Immature Granulocytes 1 %   Abs Immature Granulocytes 0.08 (H) 0.00 - 0.07 K/uL  Type and screen   Collection Time: 06/15/23  7:57 AM  Result Value Ref Range   ABO/RH(D) PENDING    Antibody Screen PENDING    Sample Expiration      06/18/2023,2359 Performed at [facility], [address] [place], [place] [address]     Imaging  Studies: US RENAL  Result Date: 06/15/2023 CLINICAL DATA:  37 year old pregnant female with right flank pain. She reports multiple rounds of antibiotics recently for UTI and also passing urinary "fragments". EXAM: RENAL / URINARY TRACT ULTRASOUND COMPLETE COMPARISON:  [facility] renal ultrasound 06/11/2023. FINDINGS: Right Kidney: Renal measurements: 14.4 x 7.2 x 7.7 cm = volume: 413 mL mL. Moderate to severe right hydronephrosis (series 1, image 3) not significantly changed from 06/11/2023 (series 1, image 8 of that exam). Shadowing roughly 3 cm object in the right renal pelvis suspicious for a large obstructing stone. The renal pelvis was clear on 06/11/2023, but other large stones were visible in the right kidney at that time. And at least 1 additional upper pole stone is visible now (up to 1.6 cm). Additionally there is a small volume of pararenal space fluid (series 3). Left Kidney: Renal measurements: 12.2 x 6.5 x 6.2 cm = volume: 259 mL. Echogenicity within normal limits. No mass or hydronephrosis visualized. No left nephrolithiasis identified by ultrasound. Bladder: Decompressed.  Gravid uterus partially visible. IMPRESSION: 1. Moderate to Severe Right Hydronephrosis appears related to a large obstructing UPJ calculus (estimated at 2-3 cm). Evidence of forniceal rupture. Multiple additional right renal stones. 2. Nonobstructed left kidney with a negative ultrasound appearance. Electronically Signed   By: Odessa Fleming M.D.   On: 06/15/2023 06:56     Assessment and  Plan: 24+ wk IUP Patient Active Problem List   Diagnosis Date Noted   Urolithiasis 06/15/2023   E-coli UTI 06/15/2023   Hydronephrosis concurrent with and due to calculi of kidney and ureter 06/15/2023   Alpha thalassemia trait 07/04/2020   Supervision of high risk pregnancy, antepartum 05/18/2019   Attention deficit hyperactivity disorder (ADHD) 09/16/2016   Admit to Antenatal Consult w/ Urology - Dr. Berneice Heinrich to see and manage, request CT pelvis/abdomen W/O contrast, pending- likely nephrostomy Recurrent UTI-Continue Keflex 500 mg daily for suppression Pain management-Morphine 2.5 mg Colby q 6hrs PRN for moderate to severe pain LR 125 cc/hr NPO per urology Fetal monitoring 30 min q shift Will determine inpt status per Urology

## 2023-06-16 ENCOUNTER — Other Ambulatory Visit: Payer: Self-pay | Admitting: Radiology

## 2023-06-16 DIAGNOSIS — N132 Hydronephrosis with renal and ureteral calculous obstruction: Secondary | ICD-10-CM

## 2023-06-16 DIAGNOSIS — N200 Calculus of kidney: Secondary | ICD-10-CM

## 2023-06-16 LAB — CULTURE, OB URINE: Culture: NO GROWTH

## 2023-06-16 NOTE — Progress Notes (Signed)
Patient ID: Amber Daugherty, female   DOB: 1986/10/10, 37 y.o.   MRN: [id number]  HD #2 RIght hydronephrosis and rupture of renal calyx from complicated renal stones in pregnancy  S/p right nephrostomy by IR, Urology on consult  25 weeks  Subjective: Some right flank pain. No fever chills. Overall pain much better than 6/29   Objective: BP (!) 97/42 (BP Location: Left Arm)   Pulse 100   Temp 97.7 F (36.5 C) (Oral)   Resp 18   Ht 5\' 6"  (1.676 m)   Wt 76.1 kg   SpO2 100%   BMI 27.08 kg/m  Vital signs in last 24 hours: Temp:  [97.6 F (36.4 C)-100.4 F (38 C)] 97.7 F (36.5 C) (07/01 1132) Pulse Rate:  [84-128] 100 (07/01 1132) Resp:  [16-25] 18 (07/01 1132) BP: (89-119)/(42-65) 97/42 (07/01 1132) SpO2:  [96 %-100 %] 100 % (07/01 1132) Weight change:  Last BM Date : 06/14/23  Intake/Output from previous day: 06/30 0701 - 07/01 0700 In: 0  Out: 1825 [Urine:1725; Emesis/NG output:100] Intake/Output this shift: Total I/O In: -  Out: 750 [Urine:750]  6/30 night -- FHT 150s mod variability + accels (10x10) and no decels -reactive  Toco none 7/1 morning -- FHT 150s mod variability + accels (10x10) and no decels -reactive  Toco none  General appearance: alert and cooperative Back: negative, symmetric, no curvature. ROM normal. No CVA tenderness. Resp: clear to auscultation bilaterally Cardio: regular rate and rhythm, S1, S2 normal, no murmur, click, rub or gallop Extremities: Homans sign is negative, no sign of DVT Nephrostomy bag with clear urine   Lab Results: CBC    Component Value Date/Time   WBC 15.2 (H) 06/15/2023 0757   RBC 3.46 (L) 06/15/2023 0757   HGB 9.8 (L) 06/15/2023 0757   HCT 29.7 (L) 06/15/2023 0757   PLT 267 06/15/2023 0757   MCV 85.8 06/15/2023 0757   MCH 28.3 06/15/2023 0757   MCHC 33.0 06/15/2023 0757   RDW 14.0 06/15/2023 0757   LYMPHSABS 0.5 (L) 06/15/2023 0757   MONOABS 1.0 06/15/2023 0757   EOSABS 0.0 06/15/2023 0757   BASOSABS 0.0  06/15/2023 0757      BMET Recent Labs    06/15/23 0757  NA 132*  K 3.1*  CL 105  CO2 19*  GLUCOSE 102*  BUN 11  CREATININE 0.70  CALCIUM 8.4*    Studies/Results: IR NEPHROSTOMY PLACEMENT RIGHT  Result Date: 06/15/2023 INDICATION: Twenty-four weeks gravid patient with question of emphysematous pyelonephritis. EXAM: ULTRASOUND AND FLUOROSCOPIC GUIDED PLACEMENT OF RIGHT NEPHROSTOMY TUBE COMPARISON:  CT AP, 06/15/2023.  US Renal, 06/15/2019 MEDICATIONS: The patient was on scheduled antibiotics ANESTHESIA/SEDATION: Moderate (conscious) sedation was employed during this procedure. A total of Versed 1.5 mg and Fentanyl 75 mcg was administered intravenously. Moderate Sedation Time: 17 minutes. The patient's level of consciousness and vital signs were monitored continuously by radiology nursing throughout the procedure under my direct supervision. CONTRAST:  10 mL Isovue 300 - administered into the renal collecting system FLUOROSCOPY TIME:  Fluoroscopic dose; 7 mGy COMPLICATIONS: None immediate. PROCEDURE: The procedure, risks, benefits, and alternatives were explained to the patient and/or patient's representative, questions were encouraged and answered and informed consent was obtained. A timeout was performed prior to the initiation of the procedure. The operative site was prepped and draped in the usual sterile fashion and a sterile drape was applied covering the operative field. A sterile gown and sterile gloves were used for the procedure. Local anesthesia was provided with  1% Lidocaine with epinephrine. Ultrasound was used to localize the RIGHT kidney. Under direct ultrasound guidance, a 20 gauge needle was advanced into the renal collecting system. An ultrasound image documentation was performed. Access within the collecting system was confirmed with the efflux of urine followed by limited contrast injection. Under intermittent fluoroscopic guidance, an 0.018 wire was advanced into the collecting  system and the tract was dilated with an Accustick stent. Next, over a short Amplatz wire, the track was further dilated ultimately allowing placement of a 10 Fr percutaneous nephrostomy catheter with end coiled and locked within the renal pelvis. Contrast was injected and several spot fluoroscopic images were obtained in various obliquities. The catheter was secured at the skin entrance site with an interrupted suture and a stat lock device and connected to a gravity bag. Dressings were applied. The patient tolerated procedure well without immediate postprocedural complication. FINDINGS: Ultrasound scanning demonstrates a moderate to severely dilated RIGHT collecting system. Under a combination of ultrasound and fluoroscopic guidance, a posterior inferior calix was targeted allowing placement of a 10 Fr percutaneous nephrostomy catheter with end coiled and locked within the renal pelvis. Contrast injection confirmed appropriate positioning. IMPRESSION: Successful placement of a RIGHT sided 10 Fr percutaneous nephrostomy drainage catheter. RECOMMENDATIONS: The patient will return to Vascular Interventional Radiology (VIR) for routine drainage catheter evaluation and exchange in 6 weeks. Roanna Banning, MD [facility] Electronically Signed   By: Roanna Banning M.D.   On: 06/15/2023 13:59   CT ABDOMEN PELVIS WO CONTRAST  Addendum Date: 06/15/2023   ADDENDUM REPORT: 06/15/2023 10:05 ADDENDUM: These results were called by telephone at the time of interpretation on 06/15/2023 at 9:33 a.m. to provider Arlan Organ , who verbally acknowledged these results. Dr. 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 Electronically Signed   By: Elberta Fortis M.D.   On: 06/15/2023 10:05   Result Date: 06/15/2023 CLINICAL DATA:  Patient 24 +weeks pregnant. Urolithiasis with  multiple stones seen on recent ultrasound. EXAM: CT ABDOMEN AND PELVIS WITHOUT CONTRAST TECHNIQUE: Multidetector CT imaging of the abdomen and pelvis was performed following the standard protocol without IV contrast. RADIATION DOSE REDUCTION: This exam was performed according to the departmental dose-optimization program which includes automated exposure control, adjustment of the mA and/or kV according to patient size and/or use of iterative reconstruction technique. COMPARISON:  Ultrasound 06/11/2023 and 06/15/2023 FINDINGS: Lower chest: Lung bases are clear.  Heart is normal size. Hepatobiliary: 8 mm hypodensity over the left arm of the liver too small to characterize but likely a cyst or hemangioma. Gallbladder and biliary tree are normal. Pancreas: Normal. Spleen: Normal. Adrenals/Urinary Tract: Adrenal glands are normal. Kidneys are normal size. Couple punctate nonobstructing stones over the mid to lower pole of the left kidney. Moderate right-sided hydronephrosis with minimal air within the intrarenal collecting system and dilated right renal pelvis. Multiple large stones within the right intrarenal collecting system and layering along the dependent side of the dilated right renal pelvis. Largest stone measures 2.2 cm over the upper pole. Very minimal right perinephric fluid. Mild dilatation of the right ureter. 3-4 small stones all at the right UVJ with the largest measuring 6 mm left ureter and bladder are normal. Stomach/Bowel: Stomach and small bowel are normal. Appendix not visualized. Colon is normal. Vascular/Lymphatic: No significant vascular findings are present. No enlarged abdominal or pelvic lymph nodes. Reproductive: Evidence patient's enlarged gravid  uterus with fetus in cephalic presentation. Adnexal regions unremarkable. Other: None. Musculoskeletal: No focal abnormality. IMPRESSION: 1. Moderate right-sided hydronephrosis with associated minimal air within the intrarenal collecting system and  dilated right renal pelvis. Air in the collecting system could be due to recent instrumentation versus infection/emphysematous pyelitis. Multiple large stones within the right intrarenal collecting system with largest stone over the right upper pole measuring 2.2 cm. 3-4 small stones together at the right UVJ with the largest measuring 6 mm. 2. Couple punctate nonobstructing stones over the mid to lower pole of the left kidney. 3. 8 mm hypodensity over the left arm of the liver too small to characterize, but likely a cyst or hemangioma. 4. Enlarged gravid uterus with fetus in cephalic presentation. Electronically Signed: By: Elberta Fortis M.D. On: 06/15/2023 09:20   US RENAL  Result Date: 06/15/2023 CLINICAL DATA:  37 year old pregnant female with right flank pain. She reports multiple rounds of antibiotics recently for UTI and also passing urinary "fragments". EXAM: RENAL / URINARY TRACT ULTRASOUND COMPLETE COMPARISON:  [facility] renal ultrasound 06/11/2023. FINDINGS: Right Kidney: Renal measurements: 14.4 x 7.2 x 7.7 cm = volume: 413 mL mL. Moderate to severe right hydronephrosis (series 1, image 3) not significantly changed from 06/11/2023 (series 1, image 8 of that exam). Shadowing roughly 3 cm object in the right renal pelvis suspicious for a large obstructing stone. The renal pelvis was clear on 06/11/2023, but other large stones were visible in the right kidney at that time. And at least 1 additional upper pole stone is visible now (up to 1.6 cm). Additionally there is a small volume of pararenal space fluid (series 3). Left Kidney: Renal measurements: 12.2 x 6.5 x 6.2 cm = volume: 259 mL. Echogenicity within normal limits. No mass or hydronephrosis visualized. No left nephrolithiasis identified by ultrasound. Bladder: Decompressed.  Gravid uterus partially visible. IMPRESSION: 1. Moderate to Severe Right Hydronephrosis appears related to a large obstructing UPJ  calculus (estimated at 2-3 cm). Evidence of forniceal rupture. Multiple additional right renal stones. 2. Nonobstructed left kidney with a negative ultrasound appearance. Electronically Signed   By: Odessa Fleming M.D.   On: 06/15/2023 06:56    Medications: I have reviewed the patient's current medications. Scheduled:  cephALEXin  500 mg Oral Q6H   docusate sodium  100 mg Oral Daily   prenatal multivitamin  1 tablet Oral Q1200   sodium chloride flush  5 mL Intracatheter Q8H   Continuous:  cefTRIAXone (ROCEPHIN)  IV     lactated ringers 125 mL/hr at 06/15/23 0756   BMW:[id number], calcium carbonate, morphine injection, ondansetron  Assessment/Plan: HD #2 Right Nephrostomy - Urology and Interventional Rad following, Per pt, outpatient f/up 4 weeks   Keflex QID 3/7 day course, complete 7 days and then daily suppression rest of the pregnancy   No evidence of preterm labor   FHT reactive   Anticipate D/c tomorrow after 72 hr observation and pain control   LOS: 1 day   Robley Fries 06/16/2023, 12:26 PM

## 2023-06-16 NOTE — Progress Notes (Addendum)
Subjective: 7/1: Patient ambulating in room on my arrival.  She is having some expected flank soreness but is feeling well overall.  She was accompanied by her husband.  We were able to speak for quite a while and all of their questions were answered to their satisfaction.  Objective: Vital signs in last 24 hours: Temp:  [97.6 F (36.4 C)-100.4 F (38 C)] 97.7 F (36.5 C) (07/01 1132) Pulse Rate:  [84-128] 100 (07/01 1132) Resp:  [16-20] 18 (07/01 1132) BP: (89-119)/(42-56) 97/42 (07/01 1132) SpO2:  [96 %-100 %] 100 % (07/01 1132)  Intake/Output from previous day: 06/30 0701 - 07/01 0700 In: 0  Out: 1825 [Urine:1725; Emesis/NG output:100]  Intake/Output this shift: Total I/O In: -  Out: 900 [Urine:900]  Physical Exam:  General: Alert and oriented CV: No cyanosis Lungs: equal chest rise Abdomen: Soft, NTND, no rebound or guarding Gu: right PCNT draining clear yellow urine  Lab Results: Recent Labs    06/15/23 0757  HGB 9.8*  HCT 29.7*   BMET Recent Labs    06/15/23 0757  NA 132*  K 3.1*  CL 105  CO2 19*  GLUCOSE 102*  BUN 11  CREATININE 0.70  CALCIUM 8.4*     Studies/Results: IR NEPHROSTOMY PLACEMENT RIGHT  Result Date: 06/15/2023 INDICATION: Twenty-four weeks gravid patient with question of emphysematous pyelonephritis. EXAM: ULTRASOUND AND FLUOROSCOPIC GUIDED PLACEMENT OF RIGHT NEPHROSTOMY TUBE COMPARISON:  CT AP, 06/15/2023.  US Renal, 06/15/2019 MEDICATIONS: The patient was on scheduled antibiotics ANESTHESIA/SEDATION: Moderate (conscious) sedation was employed during this procedure. A total of Versed 1.5 mg and Fentanyl 75 mcg was administered intravenously. Moderate Sedation Time: 17 minutes. The patient's level of consciousness and vital signs were monitored continuously by radiology nursing throughout the procedure under my direct supervision. CONTRAST:  10 mL Isovue 300 - administered into the renal collecting system FLUOROSCOPY TIME:  Fluoroscopic  dose; 7 mGy COMPLICATIONS: None immediate. PROCEDURE: The procedure, risks, benefits, and alternatives were explained to the patient and/or patient's representative, questions were encouraged and answered and informed consent was obtained. A timeout was performed prior to the initiation of the procedure. The operative site was prepped and draped in the usual sterile fashion and a sterile drape was applied covering the operative field. A sterile gown and sterile gloves were used for the procedure. Local anesthesia was provided with 1% Lidocaine with epinephrine. Ultrasound was used to localize the RIGHT kidney. Under direct ultrasound guidance, a 20 gauge needle was advanced into the renal collecting system. An ultrasound image documentation was performed. Access within the collecting system was confirmed with the efflux of urine followed by limited contrast injection. Under intermittent fluoroscopic guidance, an 0.018 wire was advanced into the collecting system and the tract was dilated with an Accustick stent. Next, over a short Amplatz wire, the track was further dilated ultimately allowing placement of a 10 Fr percutaneous nephrostomy catheter with end coiled and locked within the renal pelvis. Contrast was injected and several spot fluoroscopic images were obtained in various obliquities. The catheter was secured at the skin entrance site with an interrupted suture and a stat lock device and connected to a gravity bag. Dressings were applied. The patient tolerated procedure well without immediate postprocedural complication. FINDINGS: Ultrasound scanning demonstrates a moderate to severely dilated RIGHT collecting system. Under a combination of ultrasound and fluoroscopic guidance, a posterior inferior calix was targeted allowing placement of a 10 Fr percutaneous nephrostomy catheter with end coiled and locked within the renal pelvis. Contrast injection  confirmed appropriate positioning. IMPRESSION: Successful  placement of a RIGHT sided 10 Fr percutaneous nephrostomy drainage catheter. RECOMMENDATIONS: The patient will return to Vascular Interventional Radiology (VIR) for routine drainage catheter evaluation and exchange in 6 weeks. Roanna Banning, MD [facility] Electronically Signed   By: Roanna Banning M.D.   On: 06/15/2023 13:59   CT ABDOMEN PELVIS WO CONTRAST  Addendum Date: 06/15/2023   ADDENDUM REPORT: 06/15/2023 10:05 ADDENDUM: These results were called by telephone at the time of interpretation on 06/15/2023 at 9:33 a.m. to provider Arlan Organ , who verbally acknowledged these results. Dr. 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 Electronically Signed   By: Elberta Fortis M.D.   On: 06/15/2023 10:05   Result Date: 06/15/2023 CLINICAL DATA:  Patient 24 +weeks pregnant. Urolithiasis with multiple stones seen on recent ultrasound. EXAM: CT ABDOMEN AND PELVIS WITHOUT CONTRAST TECHNIQUE: Multidetector CT imaging of the abdomen and pelvis was performed following the standard protocol without IV contrast. RADIATION DOSE REDUCTION: This exam was performed according to the departmental dose-optimization program which includes automated exposure control, adjustment of the mA and/or kV according to patient size and/or use of iterative reconstruction technique. COMPARISON:  Ultrasound 06/11/2023 and 06/15/2023 FINDINGS: Lower chest: Lung bases are clear.  Heart is normal size. Hepatobiliary: 8 mm hypodensity over the left arm of the liver too small to characterize but likely a cyst or hemangioma. Gallbladder and biliary tree are normal. Pancreas: Normal. Spleen: Normal. Adrenals/Urinary Tract: Adrenal glands are normal. Kidneys are normal size. Couple punctate nonobstructing stones over the mid to lower pole of the left kidney. Moderate right-sided  hydronephrosis with minimal air within the intrarenal collecting system and dilated right renal pelvis. Multiple large stones within the right intrarenal collecting system and layering along the dependent side of the dilated right renal pelvis. Largest stone measures 2.2 cm over the upper pole. Very minimal right perinephric fluid. Mild dilatation of the right ureter. 3-4 small stones all at the right UVJ with the largest measuring 6 mm left ureter and bladder are normal. Stomach/Bowel: Stomach and small bowel are normal. Appendix not visualized. Colon is normal. Vascular/Lymphatic: No significant vascular findings are present. No enlarged abdominal or pelvic lymph nodes. Reproductive: Evidence patient's enlarged gravid uterus with fetus in cephalic presentation. Adnexal regions unremarkable. Other: None. Musculoskeletal: No focal abnormality. IMPRESSION: 1. Moderate right-sided hydronephrosis with associated minimal air within the intrarenal collecting system and dilated right renal pelvis. Air in the collecting system could be due to recent instrumentation versus infection/emphysematous pyelitis. Multiple large stones within the right intrarenal collecting system with largest stone over the right upper pole measuring 2.2 cm. 3-4 small stones together at the right UVJ with the largest measuring 6 mm. 2. Couple punctate nonobstructing stones over the mid to lower pole of the left kidney. 3. 8 mm hypodensity over the left arm of the liver too small to characterize, but likely a cyst or hemangioma. 4. Enlarged gravid uterus with fetus in cephalic presentation. Electronically Signed: By: Elberta Fortis M.D. On: 06/15/2023 09:20   US RENAL  Result Date: 06/15/2023 CLINICAL DATA:  38 year old pregnant female with right flank pain. She reports multiple rounds of antibiotics recently for UTI and also passing urinary "fragments". EXAM: RENAL / URINARY TRACT ULTRASOUND COMPLETE COMPARISON:  [facility] renal ultrasound 06/11/2023. FINDINGS: Right Kidney: Renal measurements: 14.4 x  7.2 x 7.7 cm = volume: 413 mL mL. Moderate to severe right hydronephrosis (series 1, image 3) not significantly changed from 06/11/2023 (series 1, image 8 of that exam). Shadowing roughly 3 cm object in the right renal pelvis suspicious for a large obstructing stone. The renal pelvis was clear on 06/11/2023, but other large stones were visible in the right kidney at that time. And at least 1 additional upper pole stone is visible now (up to 1.6 cm). Additionally there is a small volume of pararenal space fluid (series 3). Left Kidney: Renal measurements: 12.2 x 6.5 x 6.2 cm = volume: 259 mL. Echogenicity within normal limits. No mass or hydronephrosis visualized. No left nephrolithiasis identified by ultrasound. Bladder: Decompressed.  Gravid uterus partially visible. IMPRESSION: 1. Moderate to Severe Right Hydronephrosis appears related to a large obstructing UPJ calculus (estimated at 2-3 cm). Evidence of forniceal rupture. Multiple additional right renal stones. 2. Nonobstructed left kidney with a negative ultrasound appearance. Electronically Signed   By: Odessa Fleming M.D.   On: 06/15/2023 06:56    Assessment/Plan: # Large right renal stone, multifocal.  [redacted] weeks pregnant #AKI Around 3 cm total stone burden with associated severe hydronephrosis.  Some distal ureteral stones.  Patient continues to pass numerous small stones. Recommending nephrostomy tube exchange every 4 weeks.  Right side nephrostomy tube placed 6/30 Will require staged PCNL on outpatient basis. Flank pain has improved Interval improvement in leukocytosis.  Preserved renal function.   # Recurrent UTI Patient has taken numerous courses of Keflex and Macrobid. Thankfully no bacteria was noted on urinalysis and there has been no growth on culture.   Currently receiving Keflex 500 mg daily for suppression per OB.   LOS: 1 day   Elmon Kirschner, NP [facility] Pager: [phone]  06/16/2023, 1:08 PM

## 2023-06-16 NOTE — Progress Notes (Addendum)
Referring Physician(s): Dr. Sebastian Ache  Supervising Physician: Marliss Coots  Patient Status:  [facility] - In-pt  Chief Complaint:  Right sided obstructing stone with hydronephrosis s/p right sided nephrostomy tube placement on 6.30.24 with Dr. Donne Hazel  Subjective:  Husband at bedside. All questions and concerns answered.  Patient states the she is a "little sore" but reports feeling much better overall.  Allergies: Amoxicillin, Ketoconazole, and Sulfa antibiotics  Medications: Prior to Admission medications   Medication Sig Start Date End Date Taking? Authorizing Provider  Prenatal Vit-Fe Fumarate-FA (PRENATAL MULTIVITAMIN) TABS tablet Take 1 tablet by mouth daily at 12 noon.    [provider]     Vital Signs: BP (!) 94/52 (BP Location: Left Arm)   Pulse 84   Temp 97.7 F (36.5 C) (Oral)   Resp 18   Ht 5\' 6"  (1.676 m)   Wt 167 lb 12.8 oz (76.1 kg)   SpO2 100%   BMI 27.08 kg/m   Physical Exam Vitals and nursing note reviewed.  Constitutional:      Appearance: She is well-developed.  HENT:     Head: Normocephalic and atraumatic.  Eyes:     Conjunctiva/sclera: Conjunctivae normal.  Pulmonary:     Effort: Pulmonary effort is normal.  Genitourinary:    Comments: Positive right nephrostomy tube gravity bag. Dressing is clean dry and intact. 20 ml of  clear yellow tinged fluid noted in- gravity bag. Drain is able to be flushed easily. No  pain with flushing.  Insertion site clean and dry.    Musculoskeletal:        General: Normal range of motion.     Cervical back: Normal range of motion.  Skin:    General: Skin is warm.  Neurological:     Mental Status: She is alert and oriented to person, place, and time.     Imaging: IR NEPHROSTOMY PLACEMENT RIGHT  Result Date: 06/15/2023 INDICATION: Twenty-four weeks gravid patient with question of emphysematous pyelonephritis. EXAM: ULTRASOUND AND FLUOROSCOPIC GUIDED PLACEMENT OF RIGHT NEPHROSTOMY TUBE  COMPARISON:  CT AP, 06/15/2023.  US Renal, 06/15/2019 MEDICATIONS: The patient was on scheduled antibiotics ANESTHESIA/SEDATION: Moderate (conscious) sedation was employed during this procedure. A total of Versed 1.5 mg and Fentanyl 75 mcg was administered intravenously. Moderate Sedation Time: 17 minutes. The patient's level of consciousness and vital signs were monitored continuously by radiology nursing throughout the procedure under my direct supervision. CONTRAST:  10 mL Isovue 300 - administered into the renal collecting system FLUOROSCOPY TIME:  Fluoroscopic dose; 7 mGy COMPLICATIONS: None immediate. PROCEDURE: The procedure, risks, benefits, and alternatives were explained to the patient and/or patient's representative, questions were encouraged and answered and informed consent was obtained. A timeout was performed prior to the initiation of the procedure. The operative site was prepped and draped in the usual sterile fashion and a sterile drape was applied covering the operative field. A sterile gown and sterile gloves were used for the procedure. Local anesthesia was provided with 1% Lidocaine with epinephrine. Ultrasound was used to localize the RIGHT kidney. Under direct ultrasound guidance, a 20 gauge needle was advanced into the renal collecting system. An ultrasound image documentation was performed. Access within the collecting system was confirmed with the efflux of urine followed by limited contrast injection. Under intermittent fluoroscopic guidance, an 0.018 wire was advanced into the collecting system and the tract was dilated with an Accustick stent. Next, over a short Amplatz wire, the track was further dilated ultimately allowing placement  of a 10 Fr percutaneous nephrostomy catheter with end coiled and locked within the renal pelvis. Contrast was injected and several spot fluoroscopic images were obtained in various obliquities. The catheter was secured at the skin entrance site with an  interrupted suture and a stat lock device and connected to a gravity bag. Dressings were applied. The patient tolerated procedure well without immediate postprocedural complication. FINDINGS: Ultrasound scanning demonstrates a moderate to severely dilated RIGHT collecting system. Under a combination of ultrasound and fluoroscopic guidance, a posterior inferior calix was targeted allowing placement of a 10 Fr percutaneous nephrostomy catheter with end coiled and locked within the renal pelvis. Contrast injection confirmed appropriate positioning. IMPRESSION: Successful placement of a RIGHT sided 10 Fr percutaneous nephrostomy drainage catheter. RECOMMENDATIONS: The patient will return to Vascular Interventional Radiology (VIR) for routine drainage catheter evaluation and exchange in 6 weeks. Roanna Banning, MD [facility] Electronically Signed   By: Roanna Banning M.D.   On: 06/15/2023 13:59   CT ABDOMEN PELVIS WO CONTRAST  Addendum Date: 06/15/2023   ADDENDUM REPORT: 06/15/2023 10:05 ADDENDUM: These results were called by telephone at the time of interpretation on 06/15/2023 at 9:33 a.m. to provider Arlan Organ , who verbally acknowledged these results. Dr. 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 Electronically Signed   By: Elberta Fortis M.D.   On: 06/15/2023 10:05   Result Date: 06/15/2023 CLINICAL DATA:  Patient 24 +weeks pregnant. Urolithiasis with multiple stones seen on recent ultrasound. EXAM: CT ABDOMEN AND PELVIS WITHOUT CONTRAST TECHNIQUE: Multidetector CT imaging of the abdomen and pelvis was performed following the standard protocol without IV contrast. RADIATION DOSE REDUCTION: This exam was performed according to the departmental dose-optimization program which includes automated exposure control, adjustment of the mA and/or kV according  to patient size and/or use of iterative reconstruction technique. COMPARISON:  Ultrasound 06/11/2023 and 06/15/2023 FINDINGS: Lower chest: Lung bases are clear.  Heart is normal size. Hepatobiliary: 8 mm hypodensity over the left arm of the liver too small to characterize but likely a cyst or hemangioma. Gallbladder and biliary tree are normal. Pancreas: Normal. Spleen: Normal. Adrenals/Urinary Tract: Adrenal glands are normal. Kidneys are normal size. Couple punctate nonobstructing stones over the mid to lower pole of the left kidney. Moderate right-sided hydronephrosis with minimal air within the intrarenal collecting system and dilated right renal pelvis. Multiple large stones within the right intrarenal collecting system and layering along the dependent side of the dilated right renal pelvis. Largest stone measures 2.2 cm over the upper pole. Very minimal right perinephric fluid. Mild dilatation of the right ureter. 3-4 small stones all at the right UVJ with the largest measuring 6 mm left ureter and bladder are normal. Stomach/Bowel: Stomach and small bowel are normal. Appendix not visualized. Colon is normal. Vascular/Lymphatic: No significant vascular findings are present. No enlarged abdominal or pelvic lymph nodes. Reproductive: Evidence patient's enlarged gravid uterus with fetus in cephalic presentation. Adnexal regions unremarkable. Other: None. Musculoskeletal: No focal abnormality. IMPRESSION: 1. Moderate right-sided hydronephrosis with associated minimal air within the intrarenal collecting system and dilated right renal pelvis. Air in the collecting system could be due to recent instrumentation versus infection/emphysematous pyelitis. Multiple large stones within the right intrarenal collecting system with largest stone over the right upper pole measuring 2.2 cm. 3-4 small stones together at the right UVJ with the largest measuring 6 mm. 2. Couple punctate nonobstructing  stones over the mid to lower  pole of the left kidney. 3. 8 mm hypodensity over the left arm of the liver too small to characterize, but likely a cyst or hemangioma. 4. Enlarged gravid uterus with fetus in cephalic presentation. Electronically Signed: By: Elberta Fortis M.D. On: 06/15/2023 09:20   US RENAL  Result Date: 06/15/2023 CLINICAL DATA:  37 year old pregnant female with right flank pain. She reports multiple rounds of antibiotics recently for UTI and also passing urinary "fragments". EXAM: RENAL / URINARY TRACT ULTRASOUND COMPLETE COMPARISON:  [facility] renal ultrasound 06/11/2023. FINDINGS: Right Kidney: Renal measurements: 14.4 x 7.2 x 7.7 cm = volume: 413 mL mL. Moderate to severe right hydronephrosis (series 1, image 3) not significantly changed from 06/11/2023 (series 1, image 8 of that exam). Shadowing roughly 3 cm object in the right renal pelvis suspicious for a large obstructing stone. The renal pelvis was clear on 06/11/2023, but other large stones were visible in the right kidney at that time. And at least 1 additional upper pole stone is visible now (up to 1.6 cm). Additionally there is a small volume of pararenal space fluid (series 3). Left Kidney: Renal measurements: 12.2 x 6.5 x 6.2 cm = volume: 259 mL. Echogenicity within normal limits. No mass or hydronephrosis visualized. No left nephrolithiasis identified by ultrasound. Bladder: Decompressed.  Gravid uterus partially visible. IMPRESSION: 1. Moderate to Severe Right Hydronephrosis appears related to a large obstructing UPJ calculus (estimated at 2-3 cm). Evidence of forniceal rupture. Multiple additional right renal stones. 2. Nonobstructed left kidney with a negative ultrasound appearance. Electronically Signed   By: Odessa Fleming M.D.   On: 06/15/2023 06:56    Labs:  CBC: Recent Labs    06/15/23 0757  WBC 15.2*  HGB 9.8*  HCT 29.7*  PLT 267    COAGS: Recent Labs    06/15/23 1112  INR 1.0    BMP: Recent  Labs    06/15/23 0757  NA 132*  K 3.1*  CL 105  CO2 19*  GLUCOSE 102*  BUN 11  CALCIUM 8.4*  CREATININE 0.70  GFRNONAA >60    LIVER FUNCTION TESTS: Recent Labs    06/15/23 0757  BILITOT 0.4  AST 17  ALT 11  ALKPHOS 68  PROT 6.1*  ALBUMIN 2.7*    Assessment and Plan:  37 y.o. female inpatient. G3P1. Currently  25 weeks. Admitted for back pain and known urolithiasis. Found to have right sided hydronephrosis with multiple large  obstructing right sided stones. The largest stones measuring 6 mm at the right UVJ. 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 Milford Cage.  Drain Location: right kidney Size: Fr size: 10 Fr Date of placement: 6.30.24  Currently to: Drain collection device: gravity 24 hour output:  150 ml today, 1050 ml yesterday  Interval imaging/drain manipulation:  None since drain placement  Current examination: Dressed appropriately.   Patient to stable from IR perspective s/p right nephrostomy tube placement further plans per Urology/ OBGYN Team please call IR with questions or concerns.   Orders placed for nephrostomy tube exchange in 8 weeks. IR scheduler will call with appoinment date and time. Please call [phone] with any questions or concerns   Electronically Signed: Alene Mires, NP 06/16/2023, 9:13 AM   I spent a total of 15 Minutes at the patient's bedside AND on the patient's hospital floor or unit, greater than 50%  of which was counseling/coordinating care for right sided nephrostomy tube placement

## 2023-06-17 MED ORDER — CEPHALEXIN 500 MG PO CAPS: 500.0000 mg | ORAL_CAPSULE | Freq: Four times a day (QID) | ORAL | 0 refills | Status: AC

## 2023-06-17 NOTE — Progress Notes (Signed)
Dressing changed. Neprhostomy tube intact; flushed, unremarkable

## 2023-06-17 NOTE — Plan of Care (Signed)
  Problem: Education: Goal: Knowledge of disease or condition will improve Outcome: Completed/Met Goal: Knowledge of the prescribed therapeutic regimen will improve Outcome: Completed/Met   Problem: Health Behavior/Discharge Planning: Goal: Ability to manage health-related needs will improve Outcome: Completed/Met   Problem: Clinical Measurements: Goal: Ability to maintain clinical measurements within normal limits will improve Outcome: Completed/Met Goal: Will remain free from infection Outcome: Completed/Met Goal: Diagnostic test results will improve Outcome: Completed/Met Goal: Respiratory complications will improve Outcome: Completed/Met Goal: Cardiovascular complication will be avoided Outcome: Completed/Met   Problem: Elimination: Goal: Will not experience complications related to bowel motility Outcome: Completed/Met Goal: Will not experience complications related to urinary retention Outcome: Completed/Met

## 2023-06-17 NOTE — Progress Notes (Signed)
HD 3 RIght hydronephrosis and rupture of renal calyx from complicated renal stones in pregnancy  S/p right nephrostomy by IR, Urology managing  Some pain at nephrostomy insertion, mild; did have h/a last night - much improved this am though still present- s/p tylenol and trying to hydrate and eat Feeling much better, though; eating more food today though light - yogurt/fruit, has been able to increase fluids No ctx/bleeding/lof; +FM  Patient Vitals for the past 24 hrs:  BP Temp Temp src Pulse Resp SpO2  06/17/23 1000 (!) 103/53 -- -- (!) 101 -- --  06/17/23 0800 (!) 93/46 97.7 F (36.5 C) Oral 81 18 100 %  06/17/23 0335 (!) 97/52 97.7 F (36.5 C) Oral 81 18 100 %  06/17/23 0019 (!) 94/49 97.7 F (36.5 C) Oral 78 18 100 %  06/16/23 2035 (!) 106/44 98.3 F (36.8 C) Oral (!) 108 18 100 %  06/16/23 1132 (!) 97/42 97.7 F (36.5 C) Oral 100 18 100 %   A&ox3 Nml respirations Soft,nt,nd, gravid LE: no edema, nt bilat Nephrostomy tube draining, dressing d/c/I, nt to palpation around dressing  NST 0800 Fht: 140s, nml variability, +accels, no decels Toco; no ctx  6/1: 2152 Fht: 130s, nml variability, +accels, no decels Toco; no ctx  1516:  Fht: 130s, nml variability, +accels, no decels Toco: no ctx  Urine culture negative  A/P: 37 y/o G3P1011 at 25.1 wga Right Nephrostomy - pain much improved, doing well; plan d/c home today and f/u with urology in 3 wks (already scheduled); Keflex QID 3/7 day course, complete 7 days and then daily suppression rest of the pregnancy; pt already has OV at [facility] and u/s already scheduled and will keep this f/u plan; pt to continue to strain urine, continue good hydration, tylenol prn pain, does also have percocet for severe pain and precautions given for worsening sx   No evidence of preterm labor - precautions given   Fetal status reassuring

## 2023-06-25 LAB — CALCULI, WITH PHOTOGRAPH (CLINICAL LAB)
Calcium Oxalate Monohydrate: 10 %
Hydroxyapatite: 90 %
Weight Calculi: 15 mg

## 2023-06-29 NOTE — Discharge Summary (Signed)
Physician Discharge Summary  Patient ID: Amber Daugherty MRN: [id number] DOB/AGE: 06/13/1986 37 y.o.  Admit date: 06/15/2023 Discharge date: 06/17/23  Admission Diagnoses: renal stone  Discharge Diagnoses:  Principal Problem:   Hydronephrosis concurrent with and due to calculi of kidney and ureter Active Problems:   Supervision of high risk pregnancy, antepartum   Urolithiasis   E-coli UTI   Kidney stone complicating pregnancy, second trimester   Discharged Condition: good  Hospital Course: The patient was admitted 06/05/23 for back pain and known multiple urolithiasis with h/o recurrent UTI, at 24.6 wga. During workup,  right/ureteral stones noted. Urology consulted and underwent uncomplicated right nephrostomy tube placement into right kidney 06/15/23. She has improved pain and is doing well today. She is ready for d/c home and urology f/u planned.  She denies ctx/lof/vb and has had good FM with reassuring antenatal testing. D/c home today, f/u tomorrow for ROB and u/s at [facility], f/u urology 3 wks. She will continue a keflex qid course then daily suppression until delivery.  Consults: urology  Significant Diagnostic Studies: see above  Treatments: procedures: right nephrostomy; keflex; ivf; pain management  Discharge Exam: Blood pressure (!) 103/53, pulse (!) 101, temperature 97.7 F (36.5 C), temperature source Oral, resp. rate 18, height 5\' 6"  (1.676 m), weight 76.1 kg, SpO2 100%, unknown if currently breastfeeding.   Disposition: Discharge disposition: 01-Home or Self Care       Discharge Instructions     Discharge diet:  No restrictions   Complete by: As directed    Discharge instructions   Complete by: As directed    Ptl precautions Inform urology if worsening pain or problems with tube not draining, etc   Notify physician for a general feeling that "something is not right"   Complete by: As directed    Notify physician for increase or change in vaginal discharge    Complete by: As directed    Notify physician for intestinal cramps, with or without diarrhea, sometimes described as "gas pain"   Complete by: As directed    Notify physician for leaking of fluid   Complete by: As directed    Notify physician for low, dull backache, unrelieved by heat or Tylenol   Complete by: As directed    Notify physician for menstrual like cramps   Complete by: As directed    Notify physician for pelvic pressure   Complete by: As directed    Notify physician for uterine contractions.  These may be painless and feel like the uterus is tightening or the baby is  "balling up"   Complete by: As directed    Notify physician for vaginal bleeding   Complete by: As directed    PRETERM LABOR:  Includes any of the follwing symptoms that occur between 20 - [redacted] weeks gestation.  If these symptoms are not stopped, preterm labor can result in preterm delivery, placing your baby at risk   Complete by: As directed       Allergies as of 06/17/2023       Reactions   Amoxicillin    Ketoconazole    Sulfa Antibiotics         Medication List     TAKE these medications    prenatal multivitamin Tabs tablet Take 1 tablet by mouth daily at 12 noon.       ASK your doctor about these medications    cephALEXin 500 MG capsule Commonly known as: KEFLEX Take 1 capsule (500 mg total) by mouth every 6 (  six) hours for 4 days. Ask about: Should I take this medication?        Follow-up Information     Manny, Delbert Phenix., MD Follow up.   Specialty: Urology Why: Office will call to arrange office visit in 4-6 weeks. Contact information: [address] [place] [address] [phone]         Neta Mends, CNM Follow up.   Specialty: Obstetrics and Gynecology Contact information: [address] [place] [address] [phone]                 Signed: Vick Frees 06/29/2023, 9:50 AM

## 2023-07-14 ENCOUNTER — Ambulatory Visit (HOSPITAL_COMMUNITY)
Admission: RE | Admit: 2023-07-14 | Discharge: 2023-07-14 | Disposition: A | Discharge status: Home / Self Care | Payer: PRIVATE HEALTH INSURANCE | Source: Ambulatory Visit | Attending: Radiology | Admitting: Radiology

## 2023-07-14 ENCOUNTER — Inpatient Hospital Stay (HOSPITAL_COMMUNITY)
Admission: RE | Admit: 2023-07-14 | Payer: PRIVATE HEALTH INSURANCE | Source: Home / Self Care | Admitting: Obstetrics & Gynecology

## 2023-07-14 DIAGNOSIS — N132 Hydronephrosis with renal and ureteral calculous obstruction: Secondary | ICD-10-CM | POA: Diagnosis not present

## 2023-07-14 DIAGNOSIS — Z436 Encounter for attention to other artificial openings of urinary tract: Secondary | ICD-10-CM | POA: Insufficient documentation

## 2023-07-14 HISTORY — PX: IR NEPHROSTOMY EXCHANGE RIGHT: IMG6070

## 2023-07-14 MED ORDER — LIDOCAINE HCL 1 % IJ SOLN
INTRAMUSCULAR | Status: AC
  Filled 2023-07-14: qty 20

## 2023-07-14 MED ORDER — IOHEXOL 300 MG/ML  SOLN
50.0000 mL | Freq: Once | INTRAMUSCULAR | Status: AC | PRN
  Administered 2023-07-14: 10 mL

## 2023-07-15 ENCOUNTER — Other Ambulatory Visit (HOSPITAL_COMMUNITY): Payer: Self-pay | Admitting: Interventional Radiology

## 2023-07-15 DIAGNOSIS — N132 Hydronephrosis with renal and ureteral calculous obstruction: Secondary | ICD-10-CM

## 2023-08-11 ENCOUNTER — Other Ambulatory Visit (HOSPITAL_COMMUNITY): Payer: Self-pay | Admitting: Interventional Radiology

## 2023-08-11 ENCOUNTER — Ambulatory Visit (HOSPITAL_COMMUNITY)
Admission: RE | Admit: 2023-08-11 | Discharge: 2023-08-11 | Disposition: A | Discharge status: Home / Self Care | Payer: PRIVATE HEALTH INSURANCE | Source: Ambulatory Visit | Attending: Interventional Radiology | Admitting: Interventional Radiology

## 2023-08-11 DIAGNOSIS — N132 Hydronephrosis with renal and ureteral calculous obstruction: Secondary | ICD-10-CM

## 2023-08-11 DIAGNOSIS — Z436 Encounter for attention to other artificial openings of urinary tract: Secondary | ICD-10-CM | POA: Diagnosis present

## 2023-08-11 HISTORY — PX: IR NEPHROSTOMY EXCHANGE RIGHT: IMG6070

## 2023-08-11 MED ORDER — IOHEXOL 300 MG/ML  SOLN
50.0000 mL | Freq: Once | INTRAMUSCULAR | Status: AC | PRN
  Administered 2023-08-11: 10 mL

## 2023-08-11 MED ORDER — LIDOCAINE HCL 1 % IJ SOLN
INTRAMUSCULAR | Status: AC
  Filled 2023-08-11: qty 20

## 2023-08-11 NOTE — Procedures (Signed)
Interventional Radiology Procedure Note  Procedure: Image guided drain exchange, right PCN.  69F pigtail drain.  Complications: None  EBL: None    Recommendations: - Routine drain care  - another 4 week follow up - routine wound care  Signed,  Yvone Neu. Loreta Ave, DO, ABVM, RPVI

## 2023-09-03 ENCOUNTER — Inpatient Hospital Stay (HOSPITAL_COMMUNITY): Payer: PRIVATE HEALTH INSURANCE

## 2023-09-03 ENCOUNTER — Encounter (HOSPITAL_COMMUNITY): Payer: Self-pay | Admitting: Obstetrics & Gynecology

## 2023-09-03 ENCOUNTER — Inpatient Hospital Stay (EMERGENCY_DEPARTMENT_HOSPITAL)
Admission: AD | Admit: 2023-09-03 | Discharge: 2023-09-04 | Disposition: A | Discharge status: Home / Self Care | Payer: PRIVATE HEALTH INSURANCE | Source: Home / Self Care | Attending: Obstetrics & Gynecology | Admitting: Obstetrics & Gynecology

## 2023-09-03 DIAGNOSIS — R102 Pelvic and perineal pain: Secondary | ICD-10-CM | POA: Insufficient documentation

## 2023-09-03 DIAGNOSIS — B9620 Unspecified Escherichia coli [E. coli] as the cause of diseases classified elsewhere: Secondary | ICD-10-CM

## 2023-09-03 DIAGNOSIS — N132 Hydronephrosis with renal and ureteral calculous obstruction: Secondary | ICD-10-CM | POA: Insufficient documentation

## 2023-09-03 DIAGNOSIS — N1330 Unspecified hydronephrosis: Secondary | ICD-10-CM | POA: Diagnosis not present

## 2023-09-03 DIAGNOSIS — O09523 Supervision of elderly multigravida, third trimester: Secondary | ICD-10-CM | POA: Insufficient documentation

## 2023-09-03 DIAGNOSIS — Z87442 Personal history of urinary calculi: Secondary | ICD-10-CM | POA: Insufficient documentation

## 2023-09-03 DIAGNOSIS — Z3A36 36 weeks gestation of pregnancy: Secondary | ICD-10-CM | POA: Insufficient documentation

## 2023-09-03 DIAGNOSIS — Z936 Other artificial openings of urinary tract status: Secondary | ICD-10-CM | POA: Insufficient documentation

## 2023-09-03 DIAGNOSIS — Z87440 Personal history of urinary (tract) infections: Secondary | ICD-10-CM | POA: Insufficient documentation

## 2023-09-03 DIAGNOSIS — O26833 Pregnancy related renal disease, third trimester: Secondary | ICD-10-CM | POA: Diagnosis not present

## 2023-09-03 DIAGNOSIS — O26893 Other specified pregnancy related conditions, third trimester: Secondary | ICD-10-CM | POA: Insufficient documentation

## 2023-09-03 DIAGNOSIS — M549 Dorsalgia, unspecified: Secondary | ICD-10-CM | POA: Insufficient documentation

## 2023-09-03 DIAGNOSIS — O99613 Diseases of the digestive system complicating pregnancy, third trimester: Secondary | ICD-10-CM | POA: Insufficient documentation

## 2023-09-03 DIAGNOSIS — Z9889 Other specified postprocedural states: Secondary | ICD-10-CM

## 2023-09-03 DIAGNOSIS — O4700 False labor before 37 completed weeks of gestation, unspecified trimester: Secondary | ICD-10-CM

## 2023-09-03 LAB — URINALYSIS, ROUTINE W REFLEX MICROSCOPIC
Bilirubin Urine: NEGATIVE
Glucose, UA: NEGATIVE mg/dL
Ketones, ur: 20 mg/dL — AB
Nitrite: NEGATIVE
Protein, ur: 100 mg/dL — AB
Specific Gravity, Urine: 1.01 (ref 1.005–1.030)
WBC, UA: >50 WBC/hpf (ref 0–5)
pH: 7 (ref 5.0–8.0)

## 2023-09-03 LAB — CBC
HCT: 32 % — ABNORMAL LOW (ref 36.0–46.0)
Hemoglobin: 10.2 g/dL — ABNORMAL LOW (ref 12.0–15.0)
MCH: 26.8 pg (ref 26.0–34.0)
MCHC: 31.9 g/dL (ref 30.0–36.0)
MCV: 84 fL (ref 80.0–100.0)
Platelets: 290 10*3/uL (ref 150–400)
RBC: 3.81 MIL/uL — ABNORMAL LOW (ref 3.87–5.11)
RDW: 15 % (ref 11.5–15.5)
WBC: 13.6 10*3/uL — ABNORMAL HIGH (ref 4.0–10.5)
nRBC: 0 % (ref 0.0–0.2)

## 2023-09-03 LAB — WET PREP, GENITAL
Clue Cells Wet Prep HPF POC: NONE SEEN
Sperm: NONE SEEN
Trich, Wet Prep: NONE SEEN
WBC, Wet Prep HPF POC: >=10 — AB (ref <10)
Yeast Wet Prep HPF POC: NONE SEEN

## 2023-09-03 LAB — TYPE AND SCREEN
ABO/RH(D): O POS
Antibody Screen: NEGATIVE

## 2023-09-03 LAB — OB RESULTS CONSOLE GBS: GBS: NEGATIVE

## 2023-09-03 MED ORDER — CYCLOBENZAPRINE HCL 5 MG PO TABS
10.0000 mg | ORAL_TABLET | Freq: Once | ORAL | Status: AC
  Administered 2023-09-03: 10 mg via ORAL
  Filled 2023-09-03: qty 2

## 2023-09-03 MED ORDER — ACETAMINOPHEN 500 MG PO TABS
1000.0000 mg | ORAL_TABLET | Freq: Once | ORAL | Status: AC
  Administered 2023-09-03: 1000 mg via ORAL
  Filled 2023-09-03: qty 2

## 2023-09-03 MED ORDER — LACTATED RINGERS IV BOLUS
1000.0000 mL | Freq: Once | INTRAVENOUS | Status: AC
  Administered 2023-09-03: 1000 mL via INTRAVENOUS

## 2023-09-03 NOTE — MAU Note (Signed)
.  Amber Daugherty is a 37 y.o. at [redacted]w[redacted]d here in MAU reporting: constant pelvic and back pain; reports pelvic pain is more intense on right side sometimes, has not tried any interventions at this time. patient has right nephrostomy in place, reports has not drained in one week, patient reports she consulted with nephrology and was told her obstruction could have changed places; reports is due to change on Monday   Denies VB, LOF and reports +FM   Onset of complaint: T Pain score: 7-8/10 Vitals:   09/03/23 2038 09/03/23 2040  BP: 139/74   Pulse: 95   Resp: 19   Temp: 98.2 F (36.8 C)   SpO2: 100% 100%     FHT:130 Lab orders placed from triage:  n/a

## 2023-09-04 ENCOUNTER — Other Ambulatory Visit: Payer: Self-pay | Admitting: Family Medicine

## 2023-09-04 DIAGNOSIS — N1330 Unspecified hydronephrosis: Secondary | ICD-10-CM | POA: Diagnosis not present

## 2023-09-04 DIAGNOSIS — O2343 Unspecified infection of urinary tract in pregnancy, third trimester: Secondary | ICD-10-CM

## 2023-09-04 DIAGNOSIS — N132 Hydronephrosis with renal and ureteral calculous obstruction: Secondary | ICD-10-CM

## 2023-09-04 LAB — COMPREHENSIVE METABOLIC PANEL
ALT: 14 U/L (ref 0–44)
AST: 18 U/L (ref 15–41)
Albumin: 2.6 g/dL — ABNORMAL LOW (ref 3.5–5.0)
Alkaline Phosphatase: 102 U/L (ref 38–126)
Anion gap: 13 (ref 5–15)
BUN: 7 mg/dL (ref 6–20)
CO2: 21 mmol/L — ABNORMAL LOW (ref 22–32)
Calcium: 9 mg/dL (ref 8.9–10.3)
Chloride: 103 mmol/L (ref 98–111)
Creatinine, Ser: 0.68 mg/dL (ref 0.44–1.00)
GFR, Estimated: >60 mL/min (ref >60)
Glucose, Bld: 99 mg/dL (ref 70–99)
Potassium: 3.4 mmol/L — ABNORMAL LOW (ref 3.5–5.1)
Sodium: 137 mmol/L (ref 135–145)
Total Bilirubin: 0.2 mg/dL — ABNORMAL LOW (ref 0.3–1.2)
Total Protein: 6.2 g/dL — ABNORMAL LOW (ref 6.5–8.1)

## 2023-09-04 LAB — GC/CHLAMYDIA PROBE AMP (~~LOC~~) NOT AT ARMC
Chlamydia: NEGATIVE
Comment: NEGATIVE
Comment: NORMAL
Neisseria Gonorrhea: NEGATIVE

## 2023-09-04 MED ORDER — OXYCODONE HCL 5 MG PO TABS
5.0000 mg | ORAL_TABLET | Freq: Four times a day (QID) | ORAL | 0 refills | Status: DC | PRN
Start: 2023-09-04 — End: 2023-10-17

## 2023-09-04 MED ORDER — CEFADROXIL 500 MG PO CAPS
500.0000 mg | ORAL_CAPSULE | Freq: Two times a day (BID) | ORAL | 0 refills | Status: DC
Start: 2023-09-04 — End: 2023-09-11

## 2023-09-04 MED ORDER — CYCLOBENZAPRINE HCL 5 MG PO TABS: 5.0000 mg | ORAL_TABLET | Freq: Three times a day (TID) | ORAL | 0 refills | Status: AC | PRN

## 2023-09-04 MED ORDER — OXYCODONE HCL 5 MG PO TABS: 5.0000 mg | ORAL_TABLET | ORAL | Status: DC

## 2023-09-04 NOTE — Addendum Note (Signed)
Addended by: Sundra Aland on: 09/04/2023 04:03 AM   Modules accepted: Orders

## 2023-09-04 NOTE — Addendum Note (Signed)
Addended by: Sundra Aland on: 09/04/2023 03:57 AM   Modules accepted: Orders

## 2023-09-04 NOTE — MAU Provider Note (Addendum)
History     CSN: [id number]  Arrival date and time: 09/03/23 2006   Event Date/Time   First Provider Initiated Contact with Patient 09/03/23 2105      Chief Complaint  Patient presents with   Back Pain    Pelvic pain    HPI Xochitl Kopko is a 37 y.o. G3P1001 at [redacted]w[redacted]d who presents with complaint of pelvic pain. Reports pelvic pain present throughout today. Describes as bilateral pelvis, but pain wraps around right side. Describes both a constant pain, but pelvic pressure is intermittent. She has a h/o multiple urolithiasis and h/o recurrent UTI s/p nephrostomy tube -- initially placed in June 2024. Since discharge, has had two exchanges of nephrostomy tube with IR. She reports seeing her urologist (Dr. Berneice Heinrich with [facility]) because she had not had any output from her nephrostomy tube over the past week. Also last week, had about 50 stones that she passed. Dr. Berneice Heinrich recommended replacement with IR, but advised sooner evaluation if she had any pain. She also describes that she was informed that the "obstruction may have moved" which further necessitated a removal and replacement of her nephrostomy tube. Denies fevers, chills, nausea, vomiting, constipation, diarrhea, dysuria, urgency/frequency, vaginal bleeding, or leaking of fluid. Reports good fetal movement.  Past Medical History:  Diagnosis Date   Irritable bowel syndrome (IBS)    Mitral valve prolapse    Pt reported     Past Surgical History:  Procedure Laterality Date   ADENOIDECTOMY  1989   IR NEPHROSTOMY EXCHANGE RIGHT  07/14/2023   IR NEPHROSTOMY EXCHANGE RIGHT  08/11/2023   IR NEPHROSTOMY PLACEMENT RIGHT  06/15/2023   LAPAROSCOPY  07/2017    History reviewed. No pertinent family history.  Social History   Tobacco Use   Smoking status: Never   Smokeless tobacco: Never  Vaping Use   Vaping status: Never Used  Substance Use Topics   Alcohol use: Never   Drug use: Never    Allergies:  Allergies  Allergen  Reactions   Amoxicillin    Ketoconazole    Sulfa Antibiotics     No medications prior to admission.   ROS reviewed and pertinent positives and negatives as documented in HPI.  Physical Exam   Blood pressure 112/65, pulse 90, temperature 98.2 F (36.8 C), temperature source Oral, resp. rate 19, height 5\' 6"  (1.676 m), weight 82.6 kg, SpO2 100%, unknown if currently breastfeeding.  Physical Exam Constitutional:      General: She is not in acute distress.    Appearance: Normal appearance.  HENT:     Head: Normocephalic and atraumatic.  Cardiovascular:     Rate and Rhythm: Normal rate and regular rhythm.     Heart sounds: Normal heart sounds.  Pulmonary:     Effort: Pulmonary effort is normal. No respiratory distress.     Breath sounds: Normal breath sounds.  Abdominal:     General: There is no distension.     Palpations: Abdomen is soft.     Tenderness: There is no abdominal tenderness. There is no right CVA tenderness or left CVA tenderness.  Musculoskeletal:        General: Normal range of motion.  Skin:    General: Skin is warm and dry.     Findings: No rash.  Neurological:     General: No focal deficit present.     Mental Status: She is alert and oriented to person, place, and time.    Cx exam: 1.5/40/-2  EFM: 135/mod/+a/-d Toco:  q2-5 min  MAU Course  Procedures  MDM 37 y.o. G3P1001 at [redacted]w[redacted]d who has history of urolithiasis which has required nephrostomy tube with multiple exchanges of tube, here with both constant right sided abdominal pain with radiation to right low back and intermittent cramping. Vital signs are stable, no CVAT on exam, well appearing, with cat I strip. Suspect her decreased PO intake in setting of recent increase in right sided pain may be driving some degree of uterine irritability causing ctx -- will give LR bolus and re-evaluate contractions. Also concerning for possible urologic cause of pt's symptoms-- has complicated urologic hx,  nephrostomy tube with increased stone output and decreased output make obstruction/hydronephrosis a possibility. Will obtain CBC, CMP, UA, U/S to eval for obstructive process. Ordering Tylenol and Flexeril for pain.  12:04 AM Labs back -- CBC with WBC of 13.6, HgB 10.2, UA w bacteria, leuks, but negative nitrites (currently on suppression abx with Keflex) -- leuks appear unchanged from previous, negative nitrites, but will tx empirically for UTI with Duricef course and cx urine. Low suspicion for pyelonephritis given findings on UA are not new, no CVA tenderness, afebrile and otherwise well appearing. Pain better w Tylenol and Flexeril. Ctx have decreased significantly in frequency.   U/S showing moderate to advanced right hydronephrosis and right renal calculi -- nephrostomy tube not well demonstrated on U/S, mild left sided hydronephrosis. Paged on call urology -- spoke with Dr. Laverle Patter regarding patient presentation and U/S results -- he recommends ensuring adequate pain control and for patient to call IR for sooner nephrostomy tube exchange in the AM.   Discussed results and recommendations per urology with patient in detail. She feels pain is well controlled, ctx have decreased in frequency. She is comfortable with discharge at this time. D/c pain regimen -- Tylenol, Flexeril, Roxicodone for breakthrough pain. Will rx Duricef for empiric UTI coverage. Stable for d/c.  Assessment and Plan  Hydronephrosis, right  History of removal of calculus of renal pelvis through percutaneous nephrostomy  H/o E-coli UTI: UA w bacteria, leukocytes, neg nitrites -- sending Duricef and awaiting cx data  pt to call IR in AM for sooner appt for nephrostomy tube exchange  Preterm contractions: Resolved with 1L LR  rec hydration  labor precautions discussed  Patient discussed with Dr. Lovie Chol, MD OB Fellow, [facility]  09/04/2023, 4:07 AM

## 2023-09-07 ENCOUNTER — Encounter (HOSPITAL_COMMUNITY): Payer: Self-pay | Admitting: Obstetrics and Gynecology

## 2023-09-07 ENCOUNTER — Inpatient Hospital Stay (HOSPITAL_COMMUNITY)
Admission: AD | Admit: 2023-09-07 | Discharge: 2023-09-09 | DRG: 832 | Disposition: A | Discharge status: Home / Self Care | Payer: PRIVATE HEALTH INSURANCE | Attending: Obstetrics | Admitting: Obstetrics

## 2023-09-07 DIAGNOSIS — Z882 Allergy status to sulfonamides status: Secondary | ICD-10-CM | POA: Diagnosis not present

## 2023-09-07 DIAGNOSIS — N390 Urinary tract infection, site not specified: Secondary | ICD-10-CM | POA: Diagnosis present

## 2023-09-07 DIAGNOSIS — O09523 Supervision of elderly multigravida, third trimester: Secondary | ICD-10-CM | POA: Diagnosis not present

## 2023-09-07 DIAGNOSIS — N200 Calculus of kidney: Principal | ICD-10-CM

## 2023-09-07 DIAGNOSIS — Z87440 Personal history of urinary (tract) infections: Secondary | ICD-10-CM

## 2023-09-07 DIAGNOSIS — O26833 Pregnancy related renal disease, third trimester: Secondary | ICD-10-CM | POA: Diagnosis not present

## 2023-09-07 DIAGNOSIS — Z87442 Personal history of urinary calculi: Secondary | ICD-10-CM

## 2023-09-07 DIAGNOSIS — N132 Hydronephrosis with renal and ureteral calculous obstruction: Secondary | ICD-10-CM | POA: Diagnosis present

## 2023-09-07 DIAGNOSIS — Z3A36 36 weeks gestation of pregnancy: Secondary | ICD-10-CM

## 2023-09-07 DIAGNOSIS — Z936 Other artificial openings of urinary tract status: Secondary | ICD-10-CM

## 2023-09-07 DIAGNOSIS — R102 Pelvic and perineal pain: Secondary | ICD-10-CM | POA: Diagnosis present

## 2023-09-07 DIAGNOSIS — Z888 Allergy status to other drugs, medicaments and biological substances status: Secondary | ICD-10-CM

## 2023-09-07 DIAGNOSIS — Z1624 Resistance to multiple antibiotics: Secondary | ICD-10-CM | POA: Diagnosis present

## 2023-09-07 DIAGNOSIS — Z880 Allergy status to penicillin: Secondary | ICD-10-CM | POA: Diagnosis not present

## 2023-09-07 LAB — CBC
HCT: 30.4 % — ABNORMAL LOW (ref 36.0–46.0)
Hemoglobin: 9.6 g/dL — ABNORMAL LOW (ref 12.0–15.0)
MCH: 26.3 pg (ref 26.0–34.0)
MCHC: 31.6 g/dL (ref 30.0–36.0)
MCV: 83.3 fL (ref 80.0–100.0)
Platelets: 316 10*3/uL (ref 150–400)
RBC: 3.65 MIL/uL — ABNORMAL LOW (ref 3.87–5.11)
RDW: 14.9 % (ref 11.5–15.5)
WBC: 11.7 10*3/uL — ABNORMAL HIGH (ref 4.0–10.5)
nRBC: 0 % (ref 0.0–0.2)

## 2023-09-07 LAB — COMPREHENSIVE METABOLIC PANEL
ALT: 15 U/L (ref 0–44)
AST: 19 U/L (ref 15–41)
Albumin: 2.5 g/dL — ABNORMAL LOW (ref 3.5–5.0)
Alkaline Phosphatase: 111 U/L (ref 38–126)
Anion gap: 12 (ref 5–15)
BUN: 8 mg/dL (ref 6–20)
CO2: 17 mmol/L — ABNORMAL LOW (ref 22–32)
Calcium: 8.6 mg/dL — ABNORMAL LOW (ref 8.9–10.3)
Chloride: 105 mmol/L (ref 98–111)
Creatinine, Ser: 0.69 mg/dL (ref 0.44–1.00)
GFR, Estimated: >60 mL/min (ref >60)
Glucose, Bld: 89 mg/dL (ref 70–99)
Potassium: 3.3 mmol/L — ABNORMAL LOW (ref 3.5–5.1)
Sodium: 134 mmol/L — ABNORMAL LOW (ref 135–145)
Total Bilirubin: 0.5 mg/dL (ref 0.3–1.2)
Total Protein: 6.5 g/dL (ref 6.5–8.1)

## 2023-09-07 LAB — TYPE AND SCREEN
ABO/RH(D): O POS
Antibody Screen: NEGATIVE

## 2023-09-07 LAB — CULTURE, OB URINE: Culture: >=100000 — AB

## 2023-09-07 MED ORDER — CALCIUM CARBONATE ANTACID 500 MG PO CHEW: 2.0000 | CHEWABLE_TABLET | ORAL | Status: DC | PRN

## 2023-09-07 MED ORDER — FERROUS SULFATE 325 (65 FE) MG PO TABS: 325.0000 mg | ORAL_TABLET | Freq: Every day | ORAL | Status: DC

## 2023-09-07 MED ORDER — PRENATAL MULTIVITAMIN CH
1.0000 | ORAL_TABLET | Freq: Every day | ORAL | Status: DC
  Administered 2023-09-08 – 2023-09-09 (×2): 1 via ORAL
  Filled 2023-09-07 (×2): qty 1

## 2023-09-07 MED ORDER — DOCUSATE SODIUM 100 MG PO CAPS: 100.0000 mg | ORAL_CAPSULE | Freq: Every day | ORAL | Status: DC | PRN

## 2023-09-07 MED ORDER — SODIUM CHLORIDE 0.9 % IV SOLN
2.0000 g | Freq: Two times a day (BID) | INTRAVENOUS | Status: DC
  Administered 2023-09-07 – 2023-09-08 (×3): 2 g via INTRAVENOUS
  Filled 2023-09-07 (×4): qty 12.5

## 2023-09-07 MED ORDER — ACETAMINOPHEN 325 MG PO TABS: 650.0000 mg | ORAL_TABLET | ORAL | Status: DC | PRN

## 2023-09-07 NOTE — H&P (Signed)
OB Antepartum Admission H&P  Lucresia Avalo is a 37 y.o. G2P1001 at [redacted]w[redacted]d with severe R nephrolithiasis s/p R nephrostomy tube placement (06/15/23), recurrent UTI in pregnancy (previously on Keflex suppression, switched to [facility] 3 days ago for increased pelvic pain, presented to MAU), now presenting for multi-drug resistant UTI. Patient noted that her nephrostomy tube has not been putting out any urine for last 2 weeks. Was seen by urologist outpatient, recommended waiting until scheduled nephrostomy replacement, scheduled for tomorrow AM. Her husband, at bedside, reports he changed nephrostomy tube site dressing earlier today and noted it looked normal without redness, swelling or drainage. Patient reports she has continued to urinate normally without notable hematuria. She does note that after her presentation to MAU 3 days ago for pelvic pain she passed a 5mm kidney stone with improvement in her pain following. At that MAU visit, a urine culture was obtained which resulted positive for Serratia marcescens resistant to oral antibiotics. Patient denies any current symptoms - denies dysuria, hematuria, abdominal or pelvic pain, fevers or chills. Reports positive fetal movement, denies contractions, leakage of fluid or vaginal bleeding. She spoke with Helayne Seminole to discuss results, reviewed with this provider, and instructed to come in for IV antibiotics.   OB History     Gravida  3   Para  1   Term  1   Preterm      AB      Living  1      SAB      IAB      Ectopic      Multiple  0   Live Births  1          Past Medical History:  Diagnosis Date   Irritable bowel syndrome (IBS)    Mitral valve prolapse    Pt reported    Past Surgical History:  Procedure Laterality Date   ADENOIDECTOMY  1989   IR NEPHROSTOMY EXCHANGE RIGHT  07/14/2023   IR NEPHROSTOMY EXCHANGE RIGHT  08/11/2023   IR NEPHROSTOMY PLACEMENT RIGHT  06/15/2023   LAPAROSCOPY  07/2017   Family History: family history  is not on file. Social History:  reports that she has never smoked. She has never used smokeless tobacco. She reports that she does not drink alcohol and does not use drugs.     Maternal Diabetes: No Maternal Ultrasounds/Referrals: Normal Fetal Ultrasounds or other Referrals:  None Maternal Substance Abuse:  No Significant Maternal Medications:  None Significant Maternal Lab Results:  Group B Strep negative Number of Prenatal Visits:greater than 3 verified prenatal visits Maternal Vaccinations:TDap Other Comments:  None   Blood pressure 124/63, pulse 99, temperature 98.2 F (36.8 C), temperature source Oral, resp. rate 19, height 5\' 6"  (1.676 m), weight 81.9 kg, SpO2 100%, unknown if currently breastfeeding.  GA: well appearing, NAD Chest: breathing comfortably on room air Abd: soft, non-tender, gravid Back: no CVAT bilaterally, nephrostomy tube site c/d/I with bandage overlying   EFM: baseline 130 bpm, moderate variability, + accels, - decels  Toco: irritability (pt not feeling ctxs)   Prenatal labs: ABO, Rh: --/--/O POS (09/18 2146) Antibody: NEG (09/18 2146) Rubella:   RPR:    HBsAg:    HIV:    GBS:     Assessment/Plan: 36yo G2P1001 at [redacted]w[redacted]d ith severe R nephrolithiasis s/p R nephrostomy tube placement (06/15/23), recurrent UTI in pregnancy (previously on Keflex suppression, switched to Duricef 3 days ago for increased pelvic pain, presented to MAU), now presenting for IV antibiotics for  multi-drug resistant UTI. Given complicated history with multiple nephrostomy tube exchanges, lack of nephrostomy UOP, and multiple UTI's on suppression, patient at high risk for pyelonephritis or worsening infection. Plan for admission for IV antibiotics. Will start cefepime 2g q12h per urine culture sensitivities. Patient to have nephrostomy tube replacement with IR tomorrow at 11am, will call tomorrow morning to inform of inpatient status, plan to proceed as scheduled. Plan to consult with ID  in AM to discuss oral regimen options for outpatient therapy following IV antibiotic course. Otherwise, routine antenatal care.   Antony Salmon D'iorio 09/07/2023, 9:35 PM

## 2023-09-07 NOTE — MAU Note (Signed)
..  Amber Daugherty is a 37 y.o. at [redacted]w[redacted]d here in MAU reporting:  Reports she got lab results and the culture that her urine grew is only treated by IV antibiotics. Was taking Duricef. Denies pain.  +FM Denies vaginal bleeding or leaking of fluid.   Pain score: 0/10 Vitals:   09/07/23 2021  BP: 124/63  Pulse: 99  Resp: 19  Temp: 98.2 F (36.8 C)  SpO2: 100%     FHT:140 Lab orders placed from triage:  none  Side note: Patient reports she was scheduled for a nephrostomy tube replacement with interventional radiology tomorrow at 11 am.

## 2023-09-08 ENCOUNTER — Other Ambulatory Visit: Payer: Self-pay

## 2023-09-08 ENCOUNTER — Inpatient Hospital Stay (HOSPITAL_COMMUNITY)
Admission: RE | Admit: 2023-09-08 | Discharge: 2023-09-08 | Disposition: A | Discharge status: Home / Self Care | Payer: No Typology Code available for payment source | Source: Ambulatory Visit | Attending: Interventional Radiology | Admitting: Interventional Radiology

## 2023-09-08 DIAGNOSIS — N132 Hydronephrosis with renal and ureteral calculous obstruction: Secondary | ICD-10-CM

## 2023-09-08 DIAGNOSIS — N390 Urinary tract infection, site not specified: Secondary | ICD-10-CM | POA: Diagnosis present

## 2023-09-08 HISTORY — PX: IR NEPHROSTOMY EXCHANGE RIGHT: IMG6070

## 2023-09-08 MED ORDER — LIDOCAINE HCL 1 % IJ SOLN
INTRAMUSCULAR | Status: AC
  Filled 2023-09-08: qty 20

## 2023-09-08 MED ORDER — IOHEXOL 300 MG/ML  SOLN
50.0000 mL | Freq: Once | INTRAMUSCULAR | Status: AC | PRN
  Administered 2023-09-08: 15 mL

## 2023-09-08 MED ORDER — RISAQUAD PO CAPS
1.0000 | ORAL_CAPSULE | Freq: Every day | ORAL | Status: DC
  Administered 2023-09-08 – 2023-09-09 (×2): 1 via ORAL
  Filled 2023-09-08 (×3): qty 1

## 2023-09-08 MED ORDER — SODIUM CHLORIDE 0.9% FLUSH: 10.0000 mL | INTRAVENOUS | Status: DC | PRN

## 2023-09-08 MED ORDER — FERRIC MALTOL 30 MG PO CAPS: 1.0000 | ORAL_CAPSULE | Freq: Two times a day (BID) | ORAL | Status: DC

## 2023-09-08 MED ORDER — FENTANYL CITRATE (PF) 100 MCG/2ML IJ SOLN
INTRAMUSCULAR | Status: AC
  Filled 2023-09-08: qty 2

## 2023-09-08 MED ORDER — NON FORMULARY: 1.0000 | Freq: Every day | Status: DC

## 2023-09-08 MED ORDER — SODIUM CHLORIDE 0.9% FLUSH
10.0000 mL | Freq: Two times a day (BID) | INTRAVENOUS | Status: DC
  Administered 2023-09-08: 20 mL
  Administered 2023-09-09: 10 mL

## 2023-09-08 MED ORDER — FERRIC MALTOL 30 MG PO CAPS
30.0000 mg | ORAL_CAPSULE | Freq: Two times a day (BID) | ORAL | Status: DC
  Administered 2023-09-08 – 2023-09-09 (×3): 30 mg via ORAL
  Filled 2023-09-08 (×4): qty 1

## 2023-09-08 NOTE — Procedures (Signed)
Pre Procedure Dx: Right sided nephrolithiasis; Hydronephrosis Post Procedure Dx: Same  Challenging though ultimately successful fluoroscopic guided exchange and up sizing now 12 French right-sided percutaneous nephrostomy catheter.  PLAN:  - Despite fluoroscopic guided exchanges every 4 weeks, the patient's nephrostomy catheter was completely occluded and per report, has not had output for the past 2 weeks.   - As such, the patient was instructed to flush the nephrostomy catheter with 10 cc normal saline at least once per day.  She was instructed to call the interventional radiology clinic if she were to experience little to no output from the nephrostomy catheter as this likely indicates the nephrostomy catheter is once again occluded.  - Otherwise, patient will return for fluoroscopic guided exchange in 1 month.   EBL: Trace No immediate complications.   Katherina Right, MD Pager #: [phone]

## 2023-09-08 NOTE — Progress Notes (Signed)

## 2023-09-08 NOTE — Progress Notes (Signed)
No c/o of contractions or abd pain. No uc's seen on FM tracing. FHR reactive. No vaginal bleeding or leaking of fluid.

## 2023-09-08 NOTE — Progress Notes (Signed)
Antepartum Progress Note  S: Patient seen and examined at bedside. Reports feeling overall well, no complaints overnight.   O: Vitals:   09/08/23 0415 09/08/23 0825  BP: (!) 98/52 108/69  Pulse: 91 90  Resp: 16 16  Temp: 98.1 F (36.7 C) 97.9 F (36.6 C)  SpO2: 100%    GA: well appearing, NAD Chest: breathing comfortably on room air Abd: soft, non-tender, gravid Ext: no TTP   A/P: 36yo G2P1001 at [redacted]w[redacted]d with severe R nephrolithiasis s/p R nephrostomy tube placement (06/15/23), recurrent UTI in pregnancy (previously on Keflex suppression, switched to Duricef 3 days ago for increased pelvic pain, presented to MAU), now admitted for IV antibiotics for multi-drug resistant UTI.   - Consult ID for outpatient regimen recommendations - Continue with nephrostomy tube replacement with IR as scheduled  - Routine antepartum care  D'Iorio, MD    Addendum 11:10am Case discussed with IR Dr. Milford Cage, plan to proceed with tube replacement as scheduled.   Case discussed with ID Dr. Thedore Mins. Case reviewed including urine culture results and sensitivities. She recommends 7 day course of cefepime.   Will discuss with MFM to review delivery recommendations.   Marlene Bast, MD

## 2023-09-08 NOTE — Progress Notes (Signed)
Patient Status: [facility] - In-pt  Assessment and Plan: Rena calculi, obstructive uropathy s/p percutaneous nephrostomy tube placement 06/15/23 Patient presents with non-functioning R PCN.  She last underwent regular exchange 08/11/23 without issue.  Now without output for several days and concern for new infection.  IR consulted for exchange (which was scheduled for today as an outpatient).  Discussed with Dr. Milford Cage.  Plan to proceed with exchange as scheduled.  Plan for regular exchange with local anesthetic only, however given concern for infection, pain, and non-functioning tube patient will reserve option to give 1 med.  No signs of active labor.  OB team aware and rapid team on board for monitoring.   Will need to determine next steps for tube exchange given anticipatory delivery and post-partum stone retrieval surgery.   Risks and benefits of right PCN replacement was discussed with the patient including, but not limited to, infection, bleeding, significant bleeding causing loss or decrease in renal function or damage to adjacent structures.   All of the patient's questions were answered, patient is agreeable to proceed.  Consent signed and in chart.  ______________________________________________________________________   History of Present Illness: Amber Daugherty is a 37 y.o. female with known history of obstructive uropathy and kidney stones s/p percutaneous nephrostomy tube placement 06/15/23 in IR.  She has undergone regular exchanges every 4 weeks as recommended.   Two weeks ago she noticed no output from her drain but was feeling well without s/s of infection.  After discussion with on-call Uro, it was decided to monitor.  Within the past several days she has passed several smaller stones.  Overnight she developed severe pain with s/s of infection and presented to MAU for evaluation. IR consulted for tube assessment vs. Exchange.   Allergies and medications reviewed.   Review  of Systems: A 12 point ROS discussed and pertinent positives are indicated in the HPI above.  All other systems are negative.  Review of Systems  Constitutional:  Negative for fatigue and fever.  Respiratory:  Negative for cough and shortness of breath.   Cardiovascular:  Negative for chest pain.  Gastrointestinal:  Negative for abdominal pain, nausea and vomiting.  Genitourinary:  Positive for pelvic pain (and pressure). Negative for difficulty urinating.  Musculoskeletal:  Negative for back pain.  Psychiatric/Behavioral:  Negative for behavioral problems and confusion.     Vital Signs: There were no vitals taken for this visit.  Physical Exam Vitals and nursing note reviewed.  Constitutional:      General: She is not in acute distress.    Appearance: Normal appearance. She is not ill-appearing.  HENT:     Mouth/Throat:     Mouth: Mucous membranes are moist.     Pharynx: Oropharynx is clear.  Cardiovascular:     Rate and Rhythm: Normal rate and regular rhythm.  Pulmonary:     Effort: Pulmonary effort is normal.     Breath sounds: Normal breath sounds.  Abdominal:     General: Abdomen is flat.     Palpations: Abdomen is soft.  Musculoskeletal:     Comments: R PCN  Neurological:     General: No focal deficit present.     Mental Status: She is alert and oriented to person, place, and time. Mental status is at baseline.  Psychiatric:        Mood and Affect: Mood normal.        Behavior: Behavior normal.        Thought Content: Thought content normal.  Judgment: Judgment normal.      Imaging reviewed.   Labs:  COAGS: Recent Labs    06/15/23 1112  INR 1.0    BMP: Recent Labs    06/15/23 0757 09/03/23 2338 09/07/23 2201  NA 132* 137 134*  K 3.1* 3.4* 3.3*  CL 105 103 105  CO2 19* 21* 17*  GLUCOSE 102* 99 89  BUN 11 7 8   CALCIUM 8.4* 9.0 8.6*  CREATININE 0.70 0.68 0.69  GFRNONAA >60 >60 >60       Electronically Signed: Hoyt Koch, PA 09/08/2023, 9:59 AM   I spent a total of 15 minutes in face to face in clinical consultation, greater than 50% of which was counseling/coordinating care for obstructive uropathy.

## 2023-09-08 NOTE — TOC Initial Note (Signed)
Transition of Care [facility]) - Initial/Assessment Note    Patient Details  Name: Amber Daugherty MRN: [id number] Date of Birth: 1986-04-23  Transition of Care [facility]) CM/SW Contact:    Janae Bridgeman, RN Phone Number: 09/08/2023, 5:17 PM  Clinical Narrative:                 CM spoke with attending physician and the patient is having a PICC line placed today and will need to have [facility] team assist with referral for OUtpatient IV antibiotics.  I called Jeri Modena, RNCM with [employer] and left a message to place a referral.    MD is aware that CM will follow up with the patient in the am to coordinate IV antibiotics for home.        Patient Goals and CMS Choice            Expected Discharge Plan and Services                                              Prior Living Arrangements/Services                       Activities of Daily Living Home Assistive Devices/Equipment: Other (Comment) (nephrostomy) ADL Screening (condition at time of admission) Patient's cognitive ability adequate to safely complete daily activities?: Yes Is the patient deaf or have difficulty hearing?: No Does the patient have difficulty seeing, even when wearing glasses/contacts?: No Does the patient have difficulty concentrating, remembering, or making decisions?: No Patient able to express need for assistance with ADLs?: Yes Does the patient have difficulty dressing or bathing?: No Independently performs ADLs?: Yes (appropriate for developmental age) Does the patient have difficulty walking or climbing stairs?: No Weakness of Legs: None Weakness of Arms/Hands: None  Permission Sought/Granted                  Emotional Assessment              Admission diagnosis:  Complicated UTI (urinary tract infection) [N39.0] Patient Active Problem List   Diagnosis Date Noted   Complicated UTI (urinary tract infection) 09/08/2023   Kidney stone complicating pregnancy, second  trimester 06/16/2023   Urolithiasis 06/15/2023   E-coli UTI 06/15/2023   Hydronephrosis concurrent with and due to calculi of kidney and ureter 06/15/2023   Alpha thalassemia trait 07/04/2020   Supervision of high risk pregnancy, antepartum 05/18/2019   Attention deficit hyperactivity disorder (ADHD) 09/16/2016   PCP:  Isabella Bowens, PA-C Pharmacy:   [facility] [place], [place] - [address] [place] [address] Phone: [phone] Fax: [phone]     Social Determinants of Health (SDOH) Social History: SDOH Screenings   Food Insecurity: No Food Insecurity (09/08/2023)  Housing: Low Risk  (09/08/2023)  Transportation Needs: No Transportation Needs (09/08/2023)  Utilities: Not At Risk (09/08/2023)  Financial Resource Strain: Low Risk  (04/13/2021)   [facility] from [facility] visits prior to 02/15/2023., [facility] visits prior to 02/15/2023.  Physical Activity: Insufficiently Active (04/13/2021)   Received from [facility] visits prior to 02/15/2023., [facility] visits prior to 02/15/2023.  Social Connections: Socially Integrated (04/13/2021)   Received from [facility] visits prior to 02/15/2023., [facility] visits prior to 02/15/2023.  Stress: No Stress Concern Present (04/13/2021)   [facility] prior to 02/15/2023., [facility] visits prior to 02/15/2023.  Tobacco Use: Low Risk  (09/03/2023)   SDOH Interventions:     Readmission Risk Interventions     No data to display

## 2023-09-09 MED ORDER — SODIUM CHLORIDE 0.9 % IV SOLN
2.0000 g | Freq: Two times a day (BID) | INTRAVENOUS | Status: DC
  Administered 2023-09-09: 2 g via INTRAVENOUS
  Filled 2023-09-09: qty 12.5

## 2023-09-09 MED ORDER — ERTAPENEM IV (FOR PTA / DISCHARGE USE ONLY): 1.0000 g | INTRAVENOUS | 0 refills | Status: DC

## 2023-09-09 MED ORDER — LEVOFLOXACIN 750 MG PO TABS
750.0000 mg | ORAL_TABLET | Freq: Every day | ORAL | Status: DC
  Filled 2023-09-09: qty 1

## 2023-09-09 MED ORDER — SODIUM CHLORIDE 0.9 % IV SOLN
1.0000 g | Freq: Once | INTRAVENOUS | Status: AC
  Administered 2023-09-09: 1 g via INTRAVENOUS
  Filled 2023-09-09: qty 1000

## 2023-09-09 MED ORDER — CEFEPIME IV (FOR PTA / DISCHARGE USE ONLY): 2.0000 g | Freq: Two times a day (BID) | INTRAVENOUS | 0 refills | Status: DC

## 2023-09-09 NOTE — TOC Progression Note (Signed)
Transition of Care [facility]) - Progression Note    Patient Details  Name: Amber Daugherty MRN: [id number] Date of Birth: 29-May-1986  Transition of Care [facility]) CM/SW Contact  Janae Bridgeman, RN Phone Number: 09/09/2023, 9:18 AM  Clinical Narrative:    CM spoke with Jeri Modena, RNCM with [employer] and teaching for IV infusion for home is on hold at this time until infectious disease team determines mode of antibiotic administration.  Patient has midline at this time.        Expected Discharge Plan and Services                                               Social Determinants of Health (SDOH) Interventions SDOH Screenings   Food Insecurity: No Food Insecurity (09/08/2023)  Housing: Low Risk  (09/08/2023)  Transportation Needs: No Transportation Needs (09/08/2023)  Utilities: Not At Risk (09/08/2023)  Financial Resource Strain: Low Risk  (04/13/2021)   [facility] prior to 02/15/2023., [facility] visits prior to 02/15/2023.  Physical Activity: Insufficiently Active (04/13/2021)   Received from [facility] visits prior to 02/15/2023., [facility] visits prior to 02/15/2023.  Social Connections: Socially Integrated (04/13/2021)   Received from [facility] visits prior to 02/15/2023., [facility] visits prior to 02/15/2023.  Stress: No Stress Concern Present (04/13/2021)   [facility] visits prior to 02/15/2023., [facility] visits prior to 02/15/2023.  Tobacco Use: Low Risk  (09/03/2023)    Readmission Risk Interventions     No data to display

## 2023-09-09 NOTE — Progress Notes (Signed)
PHARMACY CONSULT NOTE FOR:  OUTPATIENT  PARENTERAL ANTIBIOTIC THERAPY (OPAT)  Indication: Serratia UTI  Regimen: Cefepime 2 gm IV Q 12 hours  End date: 09/14/23  IV antibiotic discharge orders are pended. To discharging provider:  please sign these orders via discharge navigator,  Select New Orders & click on the button choice - Manage This Unsigned Work.     Thank you for allowing pharmacy to be a part of this patient's care.  Sharin Mons, PharmD, BCPS, BCIDP Infectious Diseases Clinical Pharmacist Phone: [phone] 09/09/2023, 11:25 AM

## 2023-09-09 NOTE — Progress Notes (Addendum)
Antepartum Progress Note  S: Patient seen and examined at bedside. Reports feeling overall well, no complaints overnight.   O: Vitals:   09/09/23 0435 09/09/23 0847  BP: (!) 103/52 (!) 102/57  Pulse: 82 88  Resp: 17 18  Temp: 98 F (36.7 C) 97.8 F (36.6 C)  SpO2: 100% 99%   GA: well appearing, NAD Chest: breathing comfortably on room air Abd: soft, non-tender, gravid Ext: no TTP   A/P: 36yo G2P1001 at [redacted]w[redacted]d with severe R nephrolithiasis s/p R nephrostomy tube placement (06/15/23), recurrent UTI in pregnancy (previously on Keflex suppression, switched to Duricef 3 days ago for increased pelvic pain, presented to MAU), now admitted for IV antibiotics for multi-drug resistant UTI.   - Consult ID rec outpt levaquin but PICC line already  placed - Continue with nephrostomy tube replacement- IR replaced yesterday - Routine antepartum care  DC home today on IV abx after outpt treatment arranged.  Will consult MFM for delivery recommendations

## 2023-09-09 NOTE — TOC Transition Note (Addendum)
Transition of Care [facility]) - CM/SW Discharge Note   Patient Details  Name: Amber Daugherty MRN: [id number] Date of Birth: 01-Apr-1986  Transition of Care [facility]) CM/SW Contact:  Janae Bridgeman, RN Phone Number: 09/09/2023, 12:13 PM   Clinical Narrative:    CM called and spoke with Jeri Modena, RNCM with [employer] company and she is aware that OPAT was signed and will start insurance authorization through [facility] and will call the patient to teach patient at the bedside.  [employer] will provide [facility] RN - orders placed to be co-signed by the MD.  Patient currently has a right flank catheter that is draining to gravity drainage bag and patient is aware and provides care for the emptying of bag.  Patient requested Mepilex dressing to cover occlusively - I spoke with RN and 5 dressings were requested from [employer].  [employer] is pending teaching and ordering of IV antibiotics at this time.  09/09/2023 1539 - 1438  -CM spoke with Jeri Modena, RNCM with [employer] and she states that she is on the phone with ID MD and OB provider to discuss patient antibiotic since [employer] states that Cefepime is not on their formulary.  Patient remains inpatient pending insurance authorization for IV antibiotics.  Teaching for Midline catheter was completed earlier at the beside.    Patient states that she was calling [employer] herself to speak with them since she preferred Cefepime that she has been currently prescribed at the hospital.  09/09/23 1542 - Patient called and states that she spoke with [facility] insurance provider herself and Cefepime was approved for her IV antibiotics for home.  I sent a message to [place] with [employer] and I'm waiting to here back.  09/09/2023 1638 - I called and spoke with Jeri Modena, RNCM with [employer] and the plan is for patient to receive Invanz IV today and discharge home.  Dr. Drue Second is aware and placed the OPAT order.  Bedside nursing was updated  accordingly.  Dr. Juliene Pina is placing discharge orders and summary and patient should be discharged home with her husband.   Final next level of care: Home w Home Health Services Barriers to Discharge: Continued Medical Work up (Pending IV antibiotics coordination for home)   Patient Goals and CMS Choice CMS Medicare.gov Compare Post Acute Care list provided to:: Patient Choice offered to / list presented to : Patient  Discharge Placement                         Discharge Plan and Services Additional resources added to the After Visit Summary for     Discharge Planning Services: CM Consult Post Acute Care Choice: Home Health          DME Arranged:  (Antibiotics coordination pending for home)         HH Arranged: RN HH Agency: [employer] Date HH Agency Contacted: 09/09/23 Time HH Agency Contacted: 1212 Representative spoke with at [facility] Agency: Jeri Modena, RNCM with [employer] - [employer] to provide [facility] RN  Social Determinants of Health (SDOH) Interventions SDOH Screenings   Food Insecurity: No Food Insecurity (09/08/2023)  Housing: Low Risk  (09/08/2023)  Transportation Needs: No Transportation Needs (09/08/2023)  Utilities: Not At Risk (09/08/2023)  Financial Resource Strain: Low Risk  (04/13/2021)   Received from [facility] visits prior to 02/15/2023., [facility] visits prior to 02/15/2023.  Physical Activity: Insufficiently Active (04/13/2021)   Received from [facility] visits prior to 02/15/2023., [facility] visits prior to 02/15/2023.  Social Connections: Socially Integrated (04/13/2021)   Received from [facility] visits prior to 02/15/2023., [facility] visits prior to 02/15/2023.  Stress: No Stress Concern Present (04/13/2021)   [facility] from [facility] visits prior to 02/15/2023., [facility] visits prior to  02/15/2023.  Tobacco Use: Low Risk  (09/03/2023)     Readmission Risk Interventions     No data to display

## 2023-09-09 NOTE — Progress Notes (Signed)
   09/09/23 1746  Departure Condition  Departure Condition Good  Mobility at [facility]  Patient/Caregiver Teaching Teach Back Method Used;Discharge instructions reviewed;Prescriptions reviewed;Follow-up care reviewed;Medications discussed;Patient/caregiver verbalized understanding  Departure Mode With significant other  Was procedural sedation performed on this patient during this visit? No (Patient procedure in Interventional Radiology)   Patient was alert and oriented x4, VS and pain stable at discharge.

## 2023-09-09 NOTE — Consult Note (Signed)
Reason for Consult: Very Large Renal  Stone in 3rd Trimester Pregnancy  Referring Physician: Barnett Hatter MD  Amber Daugherty is an 37 y.o. female.   HPI:   1 - Multifocal Large Right Renal / Ureteral Stones in 3rd Trimester Pregnancy - Large volume Rt renal stones (abtou 3cm total) + distal ureteral stones by CT on eval hydro, flank pain, recurrent UTI. Neph tube placed initiall 05/2023 as temporizing measure with plan for percutaneous nephrsotolithotomy abtou a month after she delivers. Last neph tube exchange 9/23 (had been malpositioned most recently previously). Delivery via scheduled induction now tentative around 9/30.   2 - Recurrent Urinary Tract Infections - recurrent cystitis per report. On cephalexin per GYN during pregnany. Large likely colonized rt stones as per above. UCX 08/2023 Serratia sens to cefepime, cipro.   PMH sig for IVF x1, lap ablation endometriosis. NO CV disease / blood thinners. Her PCP is Sidonie Dickens PA with [facility]. Her primary GYN is Ivonne Andrew CNM. She is related to [employer] in [place].   Today " Amber Daugherty " is seen in consultation for above. She had IR neph tube exchange yesterday as recommended and it was in fact malpositioned. Some bacteruria as expected with chronic tube. NO fevers. She is being discharged today, planning on induction around 9/30.    Past Medical History:  Diagnosis Date   Irritable bowel syndrome (IBS)    Mitral valve prolapse    Pt reported     Past Surgical History:  Procedure Laterality Date   ADENOIDECTOMY  1989   IR NEPHROSTOMY EXCHANGE RIGHT  07/14/2023   IR NEPHROSTOMY EXCHANGE RIGHT  08/11/2023   IR NEPHROSTOMY EXCHANGE RIGHT  09/08/2023   IR NEPHROSTOMY PLACEMENT RIGHT  06/15/2023   LAPAROSCOPY  07/2017    No family history on file.  Social History:  reports that she has never smoked. She has never used smokeless tobacco. She reports that she does not drink alcohol and does not use drugs.  Allergies:   Allergies  Allergen Reactions   Amoxicillin    Ketoconazole    Sulfa Antibiotics     Medications: I have reviewed the patient's current medications.  Results for orders placed or performed during the hospital encounter of 09/07/23 (from the past 48 hour(s))  Type and screen [facility]     Status: None   Collection Time: 09/07/23  9:58 PM  Result Value Ref Range   ABO/RH(D) O POS    Antibody Screen NEG    Sample Expiration      09/10/2023,2359 Performed at [facility], [address] [place], [place] [address]   Comprehensive metabolic panel     Status: Abnormal   Collection Time: 09/07/23 10:01 PM  Result Value Ref Range   Sodium 134 (L) 135 - 145 mmol/L   Potassium 3.3 (L) 3.5 - 5.1 mmol/L   Chloride 105 98 - 111 mmol/L   CO2 17 (L) 22 - 32 mmol/L   Glucose, Bld 89 70 - 99 mg/dL    Comment: Glucose reference range applies only to samples taken after fasting for at least 8 hours.   BUN 8 6 - 20 mg/dL   Creatinine, Ser 6.29 0.44 - 1.00 mg/dL   Calcium 8.6 (L) 8.9 - 10.3 mg/dL   Total Protein 6.5 6.5 - 8.1 g/dL   Albumin 2.5 (L) 3.5 - 5.0 g/dL   AST 19 15 - 41 U/L   ALT 15 0 - 44 U/L  Alkaline Phosphatase 111 38 - 126 U/L   Total Bilirubin 0.5 0.3 - 1.2 mg/dL   GFR, Estimated >21 >30 mL/min    Comment: (NOTE) Calculated using the CKD-EPI Creatinine Equation (2021)    Anion gap 12 5 - 15    Comment: Performed at [facility], [address], [place], [place] [address]  CBC     Status: Abnormal   Collection Time: 09/07/23 10:01 PM  Result Value Ref Range   WBC 11.7 (H) 4.0 - 10.5 K/uL   RBC 3.65 (L) 3.87 - 5.11 MIL/uL   Hemoglobin 9.6 (L) 12.0 - 15.0 g/dL   HCT 46.9 (L) 62.9 - 52.8 %   MCV 83.3 80.0 - 100.0 fL   MCH 26.3 26.0 - 34.0 pg   MCHC 31.6 30.0 - 36.0 g/dL   RDW 41.3 24.4 - 01.0 %   Platelets 316 150 - 400 K/uL   nRBC 0.0 0.0 - 0.2 %    Comment: Performed at [facility], [address], [place], [place] [address]   Urine Culture     Status: Abnormal (Preliminary result)   Collection Time: 09/07/23 10:01 PM   Specimen: Urine, Clean Catch  Result Value Ref Range   Specimen Description URINE, CLEAN CATCH    Special Requests NONE    Culture (A)     >=100,000 COLONIES/mL SERRATIA MARCESCENS CONFIRMATION OF SUSCEPTIBILITIES IN PROGRESS Performed at [facility], [address] [place], [place] [address]    Report Status PENDING     IR NEPHROSTOMY EXCHANGE RIGHT  Result Date: 09/08/2023 INDICATION: History of nephrolithiasis and right-sided urinary obstruction, presently on a 4 week routine nephrostomy exchange schedule until her scheduled delivery date, 2 weeks from now. Ultimately, patient will undergo PCNL via the right-sided nephrostomy access approximately 1 month following delivery. The patient states she has had no output from the nephrostomy catheter for the past 2 weeks. EXAM: FLUOROSCOPIC GUIDED RIGHT SIDED NEPHROSTOMY CATHETER EXCHANGE COMPARISON:  Multiple previous nephrostomy catheter exchanges, most recently on 08/11/2023 CONTRAST:  15 cc Omnipaque 300 administered into the collecting system FLUOROSCOPY TIME:  3 minutes, 12 seconds (250 mGy) COMPLICATIONS: None immediate. TECHNIQUE: Informed written consent was obtained from the patient after a discussion of the risks, benefits and alternatives to treatment. Questions regarding the procedure were encouraged and answered. A timeout was performed prior to the initiation of the procedure. The right flank and external portion of existing nephrostomy catheter were prepped and draped in the usual sterile fashion. A sterile drape was applied covering the operative field. Maximum barrier sterile technique with sterile gowns and gloves were used for the procedure. A timeout was performed prior to the initiation of the procedure. Contrast was attempted to be injected via the existing nephrostomy catheter however was unsuccessful secondary to completed  catheter occlusion. External portion of the nephrostomy catheter was cut and attempts were made to advanced both a Amplatz and stiff glidewire through the nephrostomy catheter however this proved unsuccessful secondary to complete catheter occlusion As such, a Kumpe catheter was advanced alongside existing nephrostomy catheter to the level of the right renal pelvis. Contrast injection confirmed appropriate positioning Next, with some difficulty, the existing nephrostomy catheters removed and over a short Amplatz wire, the Kumpe catheter was exchanged for a new, slightly larger now 12 French nephrostomy catheter with end coiled and locked within the right renal pelvis. Limited contrast injection confirmed appropriate positioning. Postprocedural spot fluoroscopic image was obtained and the procedure was terminated.  A dressing was placed. The patient tolerated the procedure well without immediate postprocedural complication. FINDINGS: The existing nephrostomy catheter is completely occluded, refractory to attempted contrast injection and/or wire cannulation. As such, a Kumpe catheter was advanced along the nephrostomy tract to the level of the renal pelvis. At this time, using forceful manual retraction, the existing nephrostomy catheter was removed and new, slightly larger now 12 French nephrostomy catheter was placed with end coiled and locked within the renal pelvis. IMPRESSION: Challenging though ultimately successful fluoroscopic guided exchange and up sizing now 12 French right-sided percutaneous nephrostomy catheter. PLAN: Despite fluoroscopic guided exchanges every 4 weeks, the patient's nephrostomy catheter was completely occluded and per report, has not had output for the past 2 weeks. As such, the patient was instructed to flush the nephrostomy catheter with 10 cc normal saline at least once per day. She was instructed to call the interventional radiology clinic if she were to experience little to no output  from the nephrostomy catheter as this likely indicates the nephrostomy catheter is once again occluded. Otherwise, patient will return for fluoroscopic guided exchange in 1 month. Electronically Signed   By: Simonne Come M.D.   On: 09/08/2023 15:07   [place] EKG SITE RITE  Result Date: 09/08/2023 If Site Rite image not attached, placement could not be confirmed due to current cardiac rhythm.   Review of Systems  Constitutional:  Negative for appetite change and fever.  All other systems reviewed and are negative.  Blood pressure (!) 102/57, pulse 88, temperature 97.8 F (36.6 C), temperature source Oral, resp. rate 18, height 5\' 6"  (1.676 m), weight 81.9 kg, SpO2 99%, unknown if currently breastfeeding. Physical Exam Vitals reviewed.  Constitutional:      Comments: Pleasant. Husband at bedside.   HENT:     Head: Normocephalic.     Mouth/Throat:     Mouth: Mucous membranes are moist.  Eyes:     Pupils: Pupils are equal, round, and reactive to light.  Cardiovascular:     Rate and Rhythm: Normal rate.  Abdominal:     Comments: Gravid uterus. No TTP.   Genitourinary:    Comments: Rt neph tube with non-foul urine. No site reaction / hematomas.  Musculoskeletal:        General: Normal range of motion.  Skin:    General: Skin is warm.  Neurological:     General: No focal deficit present.     Mental Status: She is alert.  Psychiatric:        Mood and Affect: Mood normal.     Assessment/Plan:  Reinforced that she CANNOT have percutaneous stone surgery in pregnancy. It would be frankly hazardous to her and baby (risk of severe invection, pre-term labor, postioning damage to baby as prone procedure, high radiation exposure). Unfortunatlye safest plan is current plan which is continued neph rube changed 4-6 weeks minimum and then OR for PCNL at about 4-6 weeks post-delivery. This will allow breast feeding during crucial newborn time before she has to pump and dump at anesthetic and hopefully  store up some breast milk to have during that time.   Reiterated importance of GU eval PRIOR to future elective preganncies to verify stone free prior to help avoid stones in pregnancy again. She has very good understanding.  Pt and husband in agreement on plan.   GREATLY appreciate IR team and OB-GYN team thoughtful, prompt, and compassionate comanagement.     Loletta Parish. 09/09/2023, 1:13 PM

## 2023-09-10 ENCOUNTER — Encounter (HOSPITAL_COMMUNITY): Payer: Self-pay | Admitting: Obstetrics and Gynecology

## 2023-09-10 ENCOUNTER — Other Ambulatory Visit: Payer: Self-pay

## 2023-09-10 ENCOUNTER — Inpatient Hospital Stay (HOSPITAL_COMMUNITY)
Admission: AD | Admit: 2023-09-10 | Discharge: 2023-09-11 | DRG: 805 | Disposition: A | Discharge status: Home / Self Care | Payer: PRIVATE HEALTH INSURANCE | Source: Ambulatory Visit | Attending: Obstetrics & Gynecology | Admitting: Obstetrics & Gynecology

## 2023-09-10 DIAGNOSIS — O9902 Anemia complicating childbirth: Secondary | ICD-10-CM | POA: Diagnosis present

## 2023-09-10 DIAGNOSIS — O26833 Pregnancy related renal disease, third trimester: Secondary | ICD-10-CM | POA: Diagnosis present

## 2023-09-10 DIAGNOSIS — O99344 Other mental disorders complicating childbirth: Secondary | ICD-10-CM | POA: Diagnosis present

## 2023-09-10 DIAGNOSIS — N200 Calculus of kidney: Secondary | ICD-10-CM

## 2023-09-10 DIAGNOSIS — Z880 Allergy status to penicillin: Secondary | ICD-10-CM

## 2023-09-10 DIAGNOSIS — F909 Attention-deficit hyperactivity disorder, unspecified type: Secondary | ICD-10-CM | POA: Diagnosis present

## 2023-09-10 DIAGNOSIS — Z936 Other artificial openings of urinary tract status: Secondary | ICD-10-CM | POA: Diagnosis not present

## 2023-09-10 DIAGNOSIS — N136 Pyonephrosis: Secondary | ICD-10-CM | POA: Diagnosis present

## 2023-09-10 DIAGNOSIS — Z95828 Presence of other vascular implants and grafts: Secondary | ICD-10-CM

## 2023-09-10 DIAGNOSIS — Z882 Allergy status to sulfonamides status: Secondary | ICD-10-CM | POA: Diagnosis not present

## 2023-09-10 DIAGNOSIS — O3663X0 Maternal care for excessive fetal growth, third trimester, not applicable or unspecified: Principal | ICD-10-CM | POA: Diagnosis present

## 2023-09-10 DIAGNOSIS — N390 Urinary tract infection, site not specified: Principal | ICD-10-CM | POA: Diagnosis present

## 2023-09-10 DIAGNOSIS — Z3A37 37 weeks gestation of pregnancy: Secondary | ICD-10-CM

## 2023-09-10 DIAGNOSIS — D563 Thalassemia minor: Secondary | ICD-10-CM | POA: Diagnosis present

## 2023-09-10 DIAGNOSIS — Z148 Genetic carrier of other disease: Secondary | ICD-10-CM | POA: Diagnosis not present

## 2023-09-10 DIAGNOSIS — Z888 Allergy status to other drugs, medicaments and biological substances status: Secondary | ICD-10-CM

## 2023-09-10 DIAGNOSIS — B9620 Unspecified Escherichia coli [E. coli] as the cause of diseases classified elsewhere: Secondary | ICD-10-CM | POA: Diagnosis present

## 2023-09-10 DIAGNOSIS — N132 Hydronephrosis with renal and ureteral calculous obstruction: Secondary | ICD-10-CM | POA: Diagnosis present

## 2023-09-10 DIAGNOSIS — O26893 Other specified pregnancy related conditions, third trimester: Secondary | ICD-10-CM | POA: Diagnosis present

## 2023-09-10 DIAGNOSIS — O9882 Other maternal infectious and parasitic diseases complicating childbirth: Secondary | ICD-10-CM | POA: Diagnosis present

## 2023-09-10 DIAGNOSIS — N202 Calculus of kidney with calculus of ureter: Secondary | ICD-10-CM | POA: Diagnosis present

## 2023-09-10 DIAGNOSIS — N209 Urinary calculus, unspecified: Secondary | ICD-10-CM | POA: Diagnosis present

## 2023-09-10 LAB — CBC
HCT: 31.5 % — ABNORMAL LOW (ref 36.0–46.0)
Hemoglobin: 10 g/dL — ABNORMAL LOW (ref 12.0–15.0)
MCH: 25.8 pg — ABNORMAL LOW (ref 26.0–34.0)
MCHC: 31.7 g/dL (ref 30.0–36.0)
MCV: 81.4 fL (ref 80.0–100.0)
Platelets: 378 10*3/uL (ref 150–400)
RBC: 3.87 MIL/uL (ref 3.87–5.11)
RDW: 14.7 % (ref 11.5–15.5)
WBC: 10.7 10*3/uL — ABNORMAL HIGH (ref 4.0–10.5)
nRBC: 0 % (ref 0.0–0.2)

## 2023-09-10 LAB — TYPE AND SCREEN
ABO/RH(D): O POS
Antibody Screen: NEGATIVE

## 2023-09-10 LAB — URINE CULTURE: Culture: >=100000 — AB

## 2023-09-10 LAB — POCT FERN TEST: POCT Fern Test: POSITIVE

## 2023-09-10 MED ORDER — TETANUS-DIPHTH-ACELL PERTUSSIS 5-2.5-18.5 LF-MCG/0.5 IM SUSY: 0.5000 mL | PREFILLED_SYRINGE | Freq: Once | INTRAMUSCULAR | Status: DC

## 2023-09-10 MED ORDER — DIPHENHYDRAMINE HCL 25 MG PO CAPS: 25.0000 mg | ORAL_CAPSULE | Freq: Four times a day (QID) | ORAL | Status: DC | PRN

## 2023-09-10 MED ORDER — DIBUCAINE (PERIANAL) 1 % EX OINT: 1.0000 | TOPICAL_OINTMENT | CUTANEOUS | Status: DC | PRN

## 2023-09-10 MED ORDER — COCONUT OIL OIL: 1.0000 | TOPICAL_OIL | Status: DC | PRN

## 2023-09-10 MED ORDER — LACTATED RINGERS IV SOLN: INTRAVENOUS | Status: DC

## 2023-09-10 MED ORDER — PRENATAL MULTIVITAMIN CH
1.0000 | ORAL_TABLET | Freq: Every day | ORAL | Status: DC
  Administered 2023-09-10 – 2023-09-11 (×2): 1 via ORAL
  Filled 2023-09-10 (×2): qty 1

## 2023-09-10 MED ORDER — SODIUM CHLORIDE 0.9% FLUSH
3.0000 mL | Freq: Two times a day (BID) | INTRAVENOUS | Status: DC
  Administered 2023-09-11: 3 mL via INTRAVENOUS

## 2023-09-10 MED ORDER — FENTANYL CITRATE (PF) 100 MCG/2ML IJ SOLN: 50.0000 ug | INTRAMUSCULAR | Status: DC | PRN

## 2023-09-10 MED ORDER — SENNOSIDES-DOCUSATE SODIUM 8.6-50 MG PO TABS
2.0000 | ORAL_TABLET | Freq: Every day | ORAL | Status: DC
  Administered 2023-09-11: 2 via ORAL
  Filled 2023-09-10: qty 2

## 2023-09-10 MED ORDER — ONDANSETRON HCL 4 MG/2ML IJ SOLN: 4.0000 mg | Freq: Four times a day (QID) | INTRAMUSCULAR | Status: DC | PRN

## 2023-09-10 MED ORDER — OXYTOCIN BOLUS FROM INFUSION: 333.0000 mL | Freq: Once | INTRAVENOUS | Status: DC

## 2023-09-10 MED ORDER — ONDANSETRON HCL 4 MG PO TABS: 4.0000 mg | ORAL_TABLET | ORAL | Status: DC | PRN

## 2023-09-10 MED ORDER — ONDANSETRON HCL 4 MG/2ML IJ SOLN: 4.0000 mg | INTRAMUSCULAR | Status: DC | PRN

## 2023-09-10 MED ORDER — LIDOCAINE HCL (PF) 1 % IJ SOLN
30.0000 mL | INTRAMUSCULAR | Status: AC | PRN
  Administered 2023-09-10: 30 mL via SUBCUTANEOUS
  Filled 2023-09-10: qty 30

## 2023-09-10 MED ORDER — IBUPROFEN 600 MG PO TABS
600.0000 mg | ORAL_TABLET | Freq: Four times a day (QID) | ORAL | Status: DC
  Filled 2023-09-10 (×4): qty 1

## 2023-09-10 MED ORDER — SODIUM CHLORIDE 0.9 % IV SOLN
1.0000 g | Freq: Every day | INTRAVENOUS | Status: DC
  Administered 2023-09-10 – 2023-09-11 (×2): 1 g via INTRAVENOUS
  Filled 2023-09-10 (×2): qty 1000

## 2023-09-10 MED ORDER — LACTATED RINGERS IV SOLN: 500.0000 mL | INTRAVENOUS | Status: DC | PRN

## 2023-09-10 MED ORDER — ACETAMINOPHEN 500 MG PO TABS
1000.0000 mg | ORAL_TABLET | Freq: Four times a day (QID) | ORAL | Status: DC | PRN
  Administered 2023-09-10: 1000 mg via ORAL
  Filled 2023-09-10: qty 2

## 2023-09-10 MED ORDER — BENZOCAINE-MENTHOL 20-0.5 % EX AERO: 1.0000 | INHALATION_SPRAY | CUTANEOUS | Status: DC | PRN

## 2023-09-10 MED ORDER — OXYTOCIN-SODIUM CHLORIDE 30-0.9 UT/500ML-% IV SOLN: 2.5000 [IU]/h | INTRAVENOUS | Status: DC

## 2023-09-10 MED ORDER — OXYTOCIN 10 UNIT/ML IJ SOLN: 10.0000 [IU] | Freq: Once | INTRAMUSCULAR | Status: DC

## 2023-09-10 MED ORDER — ACETAMINOPHEN 325 MG PO TABS: 650.0000 mg | ORAL_TABLET | ORAL | Status: DC | PRN

## 2023-09-10 MED ORDER — WITCH HAZEL-GLYCERIN EX PADS: 1.0000 | MEDICATED_PAD | CUTANEOUS | Status: DC | PRN

## 2023-09-10 MED ORDER — SODIUM CHLORIDE 0.9% FLUSH: 3.0000 mL | INTRAVENOUS | Status: DC | PRN

## 2023-09-10 MED ORDER — SIMETHICONE 80 MG PO CHEW: 80.0000 mg | CHEWABLE_TABLET | ORAL | Status: DC | PRN

## 2023-09-10 MED ORDER — ZOLPIDEM TARTRATE 5 MG PO TABS: 5.0000 mg | ORAL_TABLET | Freq: Every evening | ORAL | Status: DC | PRN

## 2023-09-10 MED ORDER — SOD CITRATE-CITRIC ACID 500-334 MG/5ML PO SOLN: 30.0000 mL | ORAL | Status: DC | PRN

## 2023-09-10 MED ORDER — SODIUM CHLORIDE 0.9 % IV SOLN: INTRAVENOUS | Status: DC | PRN

## 2023-09-10 NOTE — MAU Note (Signed)
Amber Daugherty is a 36 y.o. at [redacted]w[redacted]d here in MAU reporting: ROM at 0700 clear fluids. CTX's every 3-5 mins. Denies any VB and endorses +FM Patient walked to room with towel between her legs.   Onset of complaint: 0700 Pain score: 5-6 Vitals:   09/10/23 0857  BP: 123/69  Pulse: 92  Resp: 18  Temp: 97.6 F (36.4 C)     FHT:150 Lab orders placed from triage:  fer, labor

## 2023-09-10 NOTE — H&P (Signed)
OB ADMISSION/ HISTORY & PHYSICAL:  Admission Date: 09/10/2023  8:41 AM  Admit Diagnosis: Normal labor [O80, Z37.9]   Amber Daugherty is a 37 y.o. female G3P1011 at [redacted]w[redacted]d presenting for SROM. Reports large gush of clear fluid at 0700. Grossly ruptured in MAU and contracting every 3-4 minutes. Denies vaginal bleeding. Endorses + fetal movement. Husband, Tresa Endo, present and supportive. Eagerly anticipating a baby boy with an undecided name.   Prenatal History: G3P1011   EDC: 09/29/2023 Prenatal care at [facility] since 10 weeks  Primary: D. Renae Fickle, CNM  Prenatal course complicated by: Nephrostomy tube in place, inpatient at 24+ wks for severe blockage R kidney, ruptured calycle fornix. Nephrostomy placed under IR, tube changes q 4 weeks. Plan transcutaneous nephrolithiasis after delivery. Dr. Berneice Heinrich urology. Recurrent UTI with E. Coli, was on Keflex 500mg  for suppression Complicated UTI, diagnosed 8/22, culture grew serratia marcescens, admitted for PICC line placement and started cefepime q 24 hours for 7 days, discharged yesterday with home health Suspected LGA, EFW 94% at 34 weeks Anemia, on PO Accrufer History of mitral valve prolapse, followed by cardiology History of gHTN with G1, IOL at 38 weeks, declined prophylactic bbASA  Prenatal Labs: ABO, Rh:   O POS Antibody: PENDING (09/25 0916) Rubella:   Immune RPR:   Non-reactive HBsAg:   Negative HIV:   Negative GBS:   Negative 1 hr Glucola : 75 Genetic Screening: Low risk Panorama XY Ultrasound: normal XY anatomy, anterior placenta, EFW 94% at 34 weeks    Maternal Diabetes: No Genetic Screening: Normal Maternal Ultrasounds/Referrals: Normal Fetal Ultrasounds or other Referrals:  None Maternal Substance Abuse:  No Significant Maternal Medications:  Meds include: Other: Cefepime IV Significant Maternal Lab Results:  Group B Strep negative Other Comments:  None  Medical / Surgical History : Past medical history:  Past  Medical History:  Diagnosis Date   Irritable bowel syndrome (IBS)    Mitral valve prolapse    Pt reported     Past surgical history:  Past Surgical History:  Procedure Laterality Date   ADENOIDECTOMY  1989   IR NEPHROSTOMY EXCHANGE RIGHT  07/14/2023   IR NEPHROSTOMY EXCHANGE RIGHT  08/11/2023   IR NEPHROSTOMY EXCHANGE RIGHT  09/08/2023   IR NEPHROSTOMY PLACEMENT RIGHT  06/15/2023   LAPAROSCOPY  07/2017    Family History: History reviewed. No pertinent family history.  Social History:  reports that she has never smoked. She has never used smokeless tobacco. She reports that she does not drink alcohol and does not use drugs.  Allergies: Amoxicillin, Ketoconazole, and Sulfa antibiotics   Current Medications at time of admission:  Medications Prior to Admission  Medication Sig Dispense Refill Last Dose   cefadroxil (DURICEF) 500 MG capsule Take 1 capsule (500 mg total) by mouth 2 (two) times daily for 7 days. 14 capsule 0 Past Week   ertapenem (INVANZ) IVPB Inject 1 g into the vein daily for 5 days. Indication:  Serratia UTI  First Dose: Yes Last Day of Therapy:  09/14/23 Labs - Once weekly:  CBC/D and BMP, Labs - Once weekly: ESR and CRP Method of administration: Mini-Bag Plus / Gravity Method of administration may be changed at the discretion of home infusion pharmacist based upon assessment of the patient and/or caregiver's ability to self-administer the medication ordered. 5 Units 0 09/09/2023   ferrous sulfate 325 (65 FE) MG tablet Take 325 mg by mouth daily with breakfast.   09/09/2023   Prenatal Vit-Fe Fumarate-FA (PRENATAL MULTIVITAMIN) TABS tablet Take  1 tablet by mouth daily at 12 noon.   09/09/2023   cephALEXin (KEFLEX) 500 MG capsule Take 500 mg by mouth daily.      cyclobenzaprine (FLEXERIL) 5 MG tablet Take 1 tablet (5 mg total) by mouth 3 (three) times daily as needed for up to 7 days for muscle spasms. 21 tablet 0 Unknown   oxyCODONE (ROXICODONE) 5 MG immediate release tablet  Take 1 tablet (5 mg total) by mouth every 6 (six) hours as needed for severe pain. 10 tablet 0 Unknown    Review of Systems: Review of Systems  All other systems reviewed and are negative.  Physical Exam: Vital signs and nursing notes reviewed.  Patient Vitals for the past 24 hrs:  BP Temp Temp src Pulse Resp  09/10/23 0921 126/67 -- -- (!) 101 --  09/10/23 0857 123/69 97.6 F (36.4 C) Oral 92 18    General: AAO x 3, NAD Heart: RRR Lungs:CTAB Abdomen: Gravid, NT Extremities: no edema SVE: Dilation: 4 Effacement (%): 70 Station: -1 Presentation: Vertex Exam by:: AYetta Barre CNM   FHR: 120BPM, moderate variability, + accels, no decels TOCO: Contractions q 3-4  Labs:   Recent Labs    09/07/23 2201  WBC 11.7*  HGB 9.6*  HCT 30.4*  PLT 316   Assessment/Plan: 37 y.o. G3P1011 at [redacted]w[redacted]d, SROM with labor Nephrostomy tube in place PICC line in place, + UTI serratia marcescens, Cefepime q 24 hours Anemia, on PO iron, HGB on admission 9.6 Suspected LGA, EFW 94% at 34 weeks  Fetal wellbeing - FHT category 1 EFW LGA 7-8lbs  Labor: Plan expectant management, Pitocin prn  GBS negative Rubella immune Rh positive  Pain control: desires unmedicated birth, open to epidural Analgesia/anesthesia PRN  Anticipated MOD: NSVB  Plans to breastfeed, plans inpatient circumcision. POC discussed with patient and support team, all questions answered.  Dr. Juliene Pina notified of admission/plan of care.  June Leap CNM, MSN 09/10/2023, 9:36 AM

## 2023-09-11 LAB — CBC
HCT: 27.3 % — ABNORMAL LOW (ref 36.0–46.0)
Hemoglobin: 9.2 g/dL — ABNORMAL LOW (ref 12.0–15.0)
MCH: 27.4 pg (ref 26.0–34.0)
MCHC: 33.7 g/dL (ref 30.0–36.0)
MCV: 81.3 fL (ref 80.0–100.0)
Platelets: 337 10*3/uL (ref 150–400)
RBC: 3.36 MIL/uL — ABNORMAL LOW (ref 3.87–5.11)
RDW: 14.6 % (ref 11.5–15.5)
WBC: 11.9 10*3/uL — ABNORMAL HIGH (ref 4.0–10.5)
nRBC: 0 % (ref 0.0–0.2)

## 2023-09-11 LAB — RPR: RPR Ser Ql: NONREACTIVE

## 2023-09-11 MED ORDER — ACETAMINOPHEN 325 MG PO TABS: 650.0000 mg | ORAL_TABLET | ORAL | Status: AC | PRN

## 2023-09-11 MED ORDER — ERTAPENEM IV (FOR PTA / DISCHARGE USE ONLY): 1.0000 g | INTRAVENOUS | 0 refills | Status: AC

## 2023-09-11 MED ORDER — SODIUM CHLORIDE 0.9 % IV SOLN: 1.0000 g | Freq: Every day | INTRAVENOUS | 7 refills | Status: DC

## 2023-09-11 MED ORDER — IBUPROFEN 600 MG PO TABS: 600.0000 mg | ORAL_TABLET | Freq: Four times a day (QID) | ORAL | 0 refills | Status: AC

## 2023-09-11 NOTE — Plan of Care (Signed)
  Problem: Education: Goal: Knowledge of disease or condition will improve Outcome: Completed/Met Goal: Knowledge of the prescribed therapeutic regimen will improve Outcome: Completed/Met Goal: Individualized Educational Video(s) Outcome: Completed/Met   Problem: Clinical Measurements: Goal: Complications related to the disease process, condition or treatment will be avoided or minimized Outcome: Completed/Met   Problem: Education: Goal: Knowledge of Childbirth will improve Outcome: Completed/Met Goal: Ability to make informed decisions regarding treatment and plan of care will improve Outcome: Completed/Met Goal: Ability to state and carry out methods to decrease the pain will improve Outcome: Completed/Met Goal: Individualized Educational Video(s) Outcome: Completed/Met   Problem: Coping: Goal: Ability to verbalize concerns and feelings about labor and delivery will improve Outcome: Completed/Met   Problem: Life Cycle: Goal: Ability to make normal progression through stages of labor will improve Outcome: Completed/Met Goal: Ability to effectively push during vaginal delivery will improve Outcome: Completed/Met   Problem: Role Relationship: Goal: Will demonstrate positive interactions with the child Outcome: Completed/Met   Problem: Safety: Goal: Risk of complications during labor and delivery will decrease Outcome: Completed/Met   Problem: Pain Management: Goal: Relief or control of pain from uterine contractions will improve Outcome: Completed/Met   Problem: Education: Goal: Knowledge of General Education information will improve Description: Including pain rating scale, medication(s)/side effects and non-pharmacologic comfort measures Outcome: Completed/Met   Problem: Health Behavior/Discharge Planning: Goal: Ability to manage health-related needs will improve Outcome: Completed/Met   Problem: Clinical Measurements: Goal: Ability to maintain clinical  measurements within normal limits will improve Outcome: Completed/Met Goal: Will remain free from infection Outcome: Completed/Met Goal: Diagnostic test results will improve Outcome: Completed/Met Goal: Respiratory complications will improve Outcome: Completed/Met Goal: Cardiovascular complication will be avoided Outcome: Completed/Met   Problem: Activity: Goal: Risk for activity intolerance will decrease Outcome: Completed/Met   Problem: Nutrition: Goal: Adequate nutrition will be maintained Outcome: Completed/Met   Problem: Coping: Goal: Level of anxiety will decrease Outcome: Completed/Met   Problem: Elimination: Goal: Will not experience complications related to bowel motility Outcome: Completed/Met Goal: Will not experience complications related to urinary retention Outcome: Completed/Met   Problem: Pain Managment: Goal: General experience of comfort will improve Outcome: Completed/Met   Problem: Safety: Goal: Ability to remain free from injury will improve Outcome: Completed/Met   Problem: Skin Integrity: Goal: Risk for impaired skin integrity will decrease Outcome: Completed/Met   Problem: Education: Goal: Knowledge of condition will improve Outcome: Completed/Met Goal: Individualized Educational Video(s) Outcome: Completed/Met Goal: Individualized Newborn Educational Video(s) Outcome: Completed/Met   Problem: Activity: Goal: Will verbalize the importance of balancing activity with adequate rest periods Outcome: Completed/Met Goal: Ability to tolerate increased activity will improve Outcome: Completed/Met   Problem: Coping: Goal: Ability to identify and utilize available resources and services will improve Outcome: Completed/Met   Problem: Life Cycle: Goal: Chance of risk for complications during the postpartum period will decrease Outcome: Completed/Met   Problem: Role Relationship: Goal: Ability to demonstrate positive interaction with newborn  will improve Outcome: Completed/Met   Problem: Skin Integrity: Goal: Demonstration of wound healing without infection will improve Outcome: Completed/Met

## 2023-09-11 NOTE — Lactation Note (Signed)
This note was copied from a baby's chart. Lactation Consultation Note  Patient Name: Amber Daugherty [id number] Date: 09/11/2023 Age:39 hours Reason for consult: Initial assessment;Early term 37-38.6wks  P2- MOB has a history of low supply and endometriosis. MOB has multiple LC's in [place] that she plans on seeing once discharged. MOB stated that she has surgery on October 2024 to remove very large kidney stones. MOB is concerned about this surgery affecting her supply. MOB stated she plans on pumping and dumping due to the medication and anesthesia given. LC explained that not all medication requires pumping and dumping. LC encouraged MOB to call lactation team after her pre-op appointment to go over the medication and it's risks while using [facility]act[facility].   Infant started stirring in MOB's arms, so LC encouraged her to latch infant. After a few attempts, infant latched and had a strong rhythmic suck. LC demonstrated how to flange infant's lips to ensure a deep latch.  LC reviewed CDC milk storage guidelines and LC services handout. MOB denied having further questions or concerns at the moment.  Maternal Data Does the patient have breastfeeding experience prior to this delivery?: Yes How long did the patient breastfeed?: MOB exclusively breastfed her first for 3ish months, then exclusively pumped for a year. MOB states she was an undersupplier, so she had to supplement with formula as well.  Feeding Mother's Current Feeding Choice: Breast Milk  LATCH Score Latch: Grasps breast easily, tongue down, lips flanged, rhythmical sucking.  Audible Swallowing: Spontaneous and intermittent  Type of Nipple: Everted at rest and after stimulation  Comfort (Breast/Nipple): Soft / non-tender  Hold (Positioning): Assistance needed to correctly position infant at breast and maintain latch.  LATCH Score: 9   Interventions Interventions: Breast feeding basics reviewed;Assisted  with latch;Breast compression;Adjust position;Education;LC Services brochure  Discharge Discharge Education: Warning signs for feeding baby Pump: DEBP;Personal  Consult Status Consult Status: Follow-up Date: 09/12/23 Follow-up type: In-patient    Dema Severin BS, IBCLC 09/11/2023, 1:53 PM

## 2023-09-11 NOTE — Progress Notes (Signed)
PHARMACY CONSULT NOTE FOR:  OUTPATIENT  PARENTERAL ANTIBIOTIC THERAPY (OPAT)  Indication: Serratia UTI  Regimen: Ertapenem 1 gm IV every 24 hours  End date: 09/14/23  IV antibiotic discharge orders are pended. To discharging provider:  please sign these orders via discharge navigator,  Select New Orders & click on the button choice - Manage This Unsigned Work.     Thank you for allowing pharmacy to be a part of this patient's care.  Sharin Mons, PharmD, BCPS, BCIDP Infectious Diseases Clinical Pharmacist Phone: [phone] 09/11/2023, 2:06 PM

## 2023-09-14 NOTE — Discharge Summary (Signed)
Physician Discharge Summary  Patient ID: Amber Daugherty MRN: [id number] DOB/AGE: 07-24-1986 37 y.o.  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 Patient noted that her nephrostomy tube has not been putting out any urine for last 2 weeks. Was seen by urologist outpatient, recommended waiting until scheduled nephrostomy replacement, scheduled for following day.  Given complicated history with multiple nephrostomy tube exchanges, lack of nephrostomy UOP, and multiple UTI's on suppression, patient at high risk for pyelonephritis or worsening infection. Plan for admission for IV antibiotics. She was started on cefepime 2g q12h per urine culture sensitivities. Patient had nephrostomy tube replacement  Midline placed 9/23 for home health.  MFM declined IOL for maternal benefit before 37 wks, so patient was discharged on 9/24, stable NST for fetus- reactive through her stay  Consults: ID, Interventional radiology, Urology   Discharge Exam: Blood pressure 108/61, pulse 98, temperature 97.9 F (36.6 C), temperature source Oral, resp. rate 17, height 5\' 6"  (1.676 m), weight 81.9 kg, SpO2 100%, unknown if currently breastfeeding.   Disposition: Discharge disposition: 01-Home or Self Care       Discharge Instructions     [facility] pharmacist to adjust dose for Vancomycin, Aminoglycosides and other anti-infective therapies as requested by physician.   Complete by:  As directed    [facility] pharmacist to adjust dose for Vancomycin, Aminoglycosides and other anti-infective therapies as requested by physician.   Complete by: As directed    [facility] to provide Cath Flo 2mg    Complete by: As directed    Administer for PICC line occlusion and as ordered by physician for other access device issues.   [facility] to provide Cath Flo 2mg    Complete by: As directed    Administer for PICC line occlusion and as ordered by physician for other access device issues.   Anaphylaxis Kit: Provided to treat any anaphylactic reaction to the medication being provided to the patient if First Dose or when requested by physician   Complete by: As directed    Epinephrine 1mg /ml vial / amp: Administer 0.3mg  (0.58ml) subcutaneously once for moderate to severe anaphylaxis, nurse to call physician and pharmacy when reaction occurs and call 911 if needed for immediate care   Diphenhydramine 50mg /ml IV vial: Administer 25-50mg  IV/IM PRN for first dose reaction, rash, itching, mild reaction, nurse to call physician and pharmacy when reaction occurs   Sodium Chloride 0.9% NS IV: Administer if needed for hypovolemic blood pressure drop or as ordered by physician after call to physician with anaphylactic reaction   Anaphylaxis Kit: Provided to treat any anaphylactic reaction to the medication being provided to the patient if First Dose or when requested by physician   Complete by: As directed    Epinephrine 1mg /ml vial / amp: Administer 0.3mg  (0.60ml) subcutaneously once for moderate to severe anaphylaxis, nurse to call physician and pharmacy when reaction occurs and call 911 if needed for immediate care   Diphenhydramine 50mg /ml IV vial: Administer 25-50mg  IV/IM PRN for first dose reaction, rash, itching, mild  reaction, nurse to call physician and pharmacy when reaction occurs   Sodium Chloride 0.9% NS IV: Administer if needed for hypovolemic  blood pressure drop or as ordered by physician after call to physician with anaphylactic reaction   Call MD for:   Complete by: As directed    Contractions, decreased fetal movements, vaginal bleeding   Call MD for:  difficulty breathing, headache or visual disturbances   Complete by: As directed    Call MD for:  extreme fatigue   Complete by: As directed    Call MD for:  hives   Complete by: As directed    Call MD for:  persistant dizziness or light-headedness   Complete by: As directed    Call MD for:  persistant nausea and vomiting   Complete by: As directed    Call MD for:  redness, tenderness, or signs of infection (pain, swelling, redness, odor or green/yellow discharge around incision site)   Complete by: As directed    Call MD for:  severe uncontrolled pain   Complete by: As directed    Call MD for:  temperature >100.4   Complete by: As directed    Change dressing on IV access line weekly and PRN   Complete by: As directed    Change dressing on IV access line weekly and PRN   Complete by: As directed    Diet - low sodium heart healthy   Complete by: As directed    Discharge wound care:   Complete by: As directed    Per IR instructions   Flush IV access with Sodium Chloride 0.9% and Heparin 10 units/ml or 100 units/ml   Complete by: As directed    Flush IV access with Sodium Chloride 0.9% and Heparin 10 units/ml or 100 units/ml   Complete by: As directed    Home infusion instructions - [facility]   Complete by: As directed    Instructions: Flush IV access with Sodium Chloride 0.9% and Heparin 10units/ml or 100units/ml   Change dressing on IV access line: Weekly and PRN   Instructions Cath Flo 2mg : Administer for PICC Line occlusion and as ordered by physician for other access device   [facility] pharmacist to adjust dose for: Vancomycin, Aminoglycosides and other anti-infective therapies as requested by physician   Home infusion instructions -  [facility]   Complete by: As directed    Instructions: Flush IV access with Sodium Chloride 0.9% and Heparin 10units/ml or 100units/ml   Change dressing on IV access line: Weekly and PRN   Instructions Cath Flo 2mg : Administer for PICC Line occlusion and as ordered by physician for other access device   [facility] pharmacist to adjust dose for: Vancomycin, Aminoglycosides and other anti-infective therapies as requested by physician   Increase activity slowly   Complete by: As directed    Method of administration may be changed at the discretion of home infusion pharmacist based upon assessment of the patient and/or caregiver's ability to self-administer the medication ordered   Complete by: As directed    Method of administration may be changed at the discretion of home infusion pharmacist based upon assessment of the patient and/or caregiver's ability to self-administer the medication ordered   Complete by: As directed    Sexual Activity Restrictions   Complete by: As directed    Until after delivery and 6 wks after      Allergies as of 09/09/2023  Reactions   Amoxicillin    Ketoconazole    Sulfa Antibiotics         Medication List     TAKE these medications    ferrous sulfate 325 (65 FE) MG tablet Take 325 mg by mouth daily with breakfast.   oxyCODONE 5 MG immediate release tablet Commonly known as: Roxicodone Take 1 tablet (5 mg total) by mouth every 6 (six) hours as needed for severe pain.   prenatal multivitamin Tabs tablet Take 1 tablet by mouth daily at 12 noon.       ASK your doctor about these medications    cyclobenzaprine 5 MG tablet Commonly known as: FLEXERIL Take 1 tablet (5 mg total) by mouth 3 (three) times daily as needed for up to 7 days for muscle spasms. Ask about: Should I take this medication?               Discharge Care Instructions  (From admission, onward)           Start     Ordered   09/09/23  0000  Change dressing on IV access line weekly and PRN  (Home infusion instructions - [facility] )        09/09/23 1128   09/09/23 0000  Change dressing on IV access line weekly and PRN  (Home infusion instructions - [facility] )        09/09/23 1452   09/09/23 0000  Discharge wound care:       Comments: Per IR instructions   09/09/23 1634            Follow-up Information     [facility] Follow up.   Why: [facility] will provide your IV anitbiotics and home health RN for follow up IV administration teaching and Midline catheter care.                SignedRobley Fries 09/14/2023, 4:42 PM

## 2023-09-15 ENCOUNTER — Inpatient Hospital Stay (HOSPITAL_COMMUNITY): Payer: PRIVATE HEALTH INSURANCE

## 2023-09-16 ENCOUNTER — Other Ambulatory Visit: Payer: Self-pay | Admitting: Urology

## 2023-09-22 ENCOUNTER — Inpatient Hospital Stay (HOSPITAL_COMMUNITY)
Admission: RE | Admit: 2023-09-22 | Payer: PRIVATE HEALTH INSURANCE | Source: Home / Self Care | Admitting: Obstetrics and Gynecology

## 2023-09-22 ENCOUNTER — Inpatient Hospital Stay (HOSPITAL_COMMUNITY): Payer: PRIVATE HEALTH INSURANCE

## 2023-10-01 NOTE — Patient Instructions (Addendum)
SURGICAL WAITING ROOM VISITATION  Patients having surgery or a procedure may have no more than 2 support people in the waiting area - these visitors may rotate.    Children under the age of 68 must have an adult with them who is not the patient.  Due to an increase in RSV and influenza rates and associated hospitalizations, children ages 17 and under may not visit patients in [facility] hospitals.  If the patient needs to stay at the hospital during part of their recovery, the visitor guidelines for inpatient rooms apply. Pre-op nurse will coordinate an appropriate time for 1 support person to accompany patient in pre-op.  This support person may not rotate.    Please refer to the [facility] website for the visitor guidelines for Inpatients (after your surgery is over and you are in a regular room).       Your procedure is scheduled on:  10/15/2023    Report to [facility] Main Entrance    Report to admitting at  0700 AM   Call this number if you have problems the morning of surgery [phone]   Do not eat food  or drink liquids :After Midnight.                                  If you have questions, please contact your surgeon's office.       Oral Hygiene is also important to reduce your risk of infection.                                    Remember - BRUSH YOUR TEETH THE MORNING OF SURGERY WITH YOUR REGULAR TOOTHPASTE  DENTURES WILL BE REMOVED PRIOR TO SURGERY PLEASE DO NOT APPLY "Poly grip" OR ADHESIVES!!!   Do NOT smoke after Midnight   Stop all vitamins and herbal supplements 7 days before surgery.   Take these medicines the morning of surgery with A SIP OF WATER:  none   DO NOT TAKE ANY ORAL DIABETIC MEDICATIONS DAY OF YOUR SURGERY  Bring CPAP mask and tubing day of surgery.                              You may not have any metal on your body including hair pins, jewelry, and body piercing             Do not wear make-up, lotions, powders,  perfumes/cologne, or deodorant  Do not wear nail polish including gel and S&S, artificial/acrylic nails, or any other type of covering on natural nails including finger and toenails. If you have artificial nails, gel coating, etc. that needs to be removed by a nail salon please have this removed prior to surgery or surgery may need to be canceled/ delayed if the surgeon/ anesthesia feels like they are unable to be safely monitored.   Do not shave  48 hours prior to surgery.               Men may shave face and neck.   Do not bring valuables to the hospital. [facility] IS NOT             RESPONSIBLE   FOR VALUABLES.   Contacts, glasses, dentures or bridgework may not be worn into surgery.   Bring small  overnight bag day of surgery.   DO NOT BRING YOUR HOME MEDICATIONS TO THE HOSPITAL. PHARMACY WILL DISPENSE MEDICATIONS LISTED ON YOUR MEDICATION LIST TO YOU DURING YOUR ADMISSION IN THE HOSPITAL!    Patients discharged on the day of surgery will not be allowed to drive home.  Someone NEEDS to stay with you for the first 24 hours after anesthesia.   Special Instructions: Bring a copy of your healthcare power of attorney and living will documents the day of surgery if you haven't scanned them before.              Please read over the following fact sheets you were given: IF YOU HAVE QUESTIONS ABOUT YOUR PRE-OP INSTRUCTIONS PLEASE CALL [phone]   If you received a COVID test during your pre-op visit  it is requested that you wear a mask when out in public, stay away from anyone that may not be feeling well and notify your surgeon if you develop symptoms. If you test positive for Covid or have been in contact with anyone that has tested positive in the last 10 days please notify you surgeon.    [facility] - Preparing for Surgery Before surgery, you can play an important role.  Because skin is not sterile, your skin needs to be as free of germs as possible.  You can reduce the number of  germs on your skin by washing with CHG (chlorahexidine gluconate) soap before surgery.  CHG is an antiseptic cleaner which kills germs and bonds with the skin to continue killing germs even after washing. Please DO NOT use if you have an allergy to CHG or antibacterial soaps.  If your skin becomes reddened/irritated stop using the CHG and inform your nurse when you arrive at Short Stay. Do not shave (including legs and underarms) for at least 48 hours prior to the first CHG shower.  You may shave your face/neck. Please follow these instructions carefully:  1.  Shower with CHG Soap the night before surgery and the  morning of Surgery.  2.  If you choose to wash your hair, wash your hair first as usual with your  normal  shampoo.  3.  After you shampoo, rinse your hair and body thoroughly to remove the  shampoo.                           4.  Use CHG as you would any other liquid soap.  You can apply chg directly  to the skin and wash                       Gently with a scrungie or clean washcloth.  5.  Apply the CHG Soap to your body ONLY FROM THE NECK DOWN.   Do not use on face/ open                           Wound or open sores. Avoid contact with eyes, ears mouth and genitals (private parts).                       Wash face,  Genitals (private parts) with your normal soap.             6.  Wash thoroughly, paying special attention to the area where your surgery  will be performed.  7.  Thoroughly rinse your body with warm  water from the neck down.  8.  DO NOT shower/wash with your normal soap after using and rinsing off  the CHG Soap.                9.  Pat yourself dry with a clean towel.            10.  Wear clean pajamas.            11.  Place clean sheets on your bed the night of your first shower and do not  sleep with pets. Day of Surgery : Do not apply any lotions/deodorants the morning of surgery.  Please wear clean clothes to the hospital/surgery center.  FAILURE TO FOLLOW THESE  INSTRUCTIONS MAY RESULT IN THE CANCELLATION OF YOUR SURGERY PATIENT SIGNATURE_________________________________  NURSE SIGNATURE__________________________________  ________________________________________________________________________

## 2023-10-01 NOTE — Progress Notes (Addendum)
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  Newborn- 09/10/23.Birth Breastfeeding.   Pt plans to have month old son in hospital room with her since breastfeeding.  There will be an adult with her also.    Pt to continue her Iron and Multivitamin until surgery.   BMP done 10/07/23 routed  to DR [facility].    1053am- Glucose on BMP results of 10/07/23- 61.  Routed to DR [facility].  Called pt at [phone] and LVMM in regards t glucose results and making sure she had eaten since left preop appt on 10/07/23.  Asked for call back.  Called and LVMM for husband at [phone] and informed husband on voice mail attemtpting to reach pt with some lab results and asked for call back. Shanda Bumps Ward,PAc aware.   PT called back and stated she was on appt with Urology when preop nurse called her at 1053am.  PT states she is currently eating a chickon, bacon cheese wrap and feels fine.  PT aware glucose was 61 and hg11.7.  PT thanked preop nurse for the call.

## 2023-10-07 ENCOUNTER — Other Ambulatory Visit: Payer: Self-pay

## 2023-10-07 ENCOUNTER — Encounter (HOSPITAL_COMMUNITY): Payer: Self-pay

## 2023-10-07 ENCOUNTER — Encounter (HOSPITAL_COMMUNITY)
Admission: RE | Admit: 2023-10-07 | Discharge: 2023-10-07 | Disposition: A | Discharge status: Home / Self Care | Payer: PRIVATE HEALTH INSURANCE | Source: Ambulatory Visit | Attending: Urology | Admitting: Urology

## 2023-10-07 VITALS — BP 105/76 | HR 84 | Temp 98.4°F | Resp 16 | Ht 66.0 in | Wt 160.0 lb

## 2023-10-07 DIAGNOSIS — Z01812 Encounter for preprocedural laboratory examination: Secondary | ICD-10-CM | POA: Insufficient documentation

## 2023-10-07 DIAGNOSIS — Z01818 Encounter for other preprocedural examination: Secondary | ICD-10-CM

## 2023-10-07 HISTORY — DX: Anemia, unspecified: D64.9

## 2023-10-07 HISTORY — DX: Personal history of urinary calculi: Z87.442

## 2023-10-07 HISTORY — DX: Family history of other specified conditions: Z84.89

## 2023-10-07 LAB — CBC
HCT: 37.8 % (ref 36.0–46.0)
Hemoglobin: 11.7 g/dL — ABNORMAL LOW (ref 12.0–15.0)
MCH: 26.2 pg (ref 26.0–34.0)
MCHC: 31 g/dL (ref 30.0–36.0)
MCV: 84.6 fL (ref 80.0–100.0)
Platelets: 336 10*3/uL (ref 150–400)
RBC: 4.47 MIL/uL (ref 3.87–5.11)
RDW: 16 % — ABNORMAL HIGH (ref 11.5–15.5)
WBC: 7.6 10*3/uL (ref 4.0–10.5)
nRBC: 0 % (ref 0.0–0.2)

## 2023-10-07 LAB — BASIC METABOLIC PANEL
Anion gap: 9 (ref 5–15)
BUN: 21 mg/dL — ABNORMAL HIGH (ref 6–20)
CO2: 25 mmol/L (ref 22–32)
Calcium: 9.5 mg/dL (ref 8.9–10.3)
Chloride: 103 mmol/L (ref 98–111)
Creatinine, Ser: 0.87 mg/dL (ref 0.44–1.00)
GFR, Estimated: >60 mL/min (ref >60)
Glucose, Bld: 61 mg/dL — ABNORMAL LOW (ref 70–99)
Potassium: 3.9 mmol/L (ref 3.5–5.1)
Sodium: 137 mmol/L (ref 135–145)

## 2023-10-14 NOTE — Anesthesia Preprocedure Evaluation (Signed)
Anesthesia Evaluation  Patient identified by MRN, date of birth, ID band Patient awake    Reviewed: Allergy & Precautions, NPO status , Patient's Chart, lab work & pertinent test results  History of Anesthesia Complications (+) Family history of anesthesia reaction  Airway Mallampati: II  TM Distance: >3 FB     Dental  (+) Dental Advisory Given, Teeth Intact   Pulmonary neg pulmonary ROS   Pulmonary exam normal breath sounds clear to auscultation       Cardiovascular negative cardio ROS Normal cardiovascular exam Rhythm:Regular Rate:Normal     Neuro/Psych  PSYCHIATRIC DISORDERS      ADHDnegative neurological ROS     GI/Hepatic negative GI ROS, Neg liver ROS,,,  Endo/Other  negative endocrine ROS    Renal/GU Renal diseaseRight staghorn calculus S/P percutaneous nephrostomy tube  negative genitourinary   Musculoskeletal negative musculoskeletal ROS (+)    Abdominal   Peds  Hematology  (+) Blood dyscrasia, anemia Alpha thalassemia trait   Anesthesia Other Findings   Reproductive/Obstetrics Lactating                              Anesthesia Physical Anesthesia Plan  ASA: 2  Anesthesia Plan: General   Post-op Pain Management: Precedex, Ofirmev IV (intra-op)* and Dilaudid IV   Induction: Intravenous  PONV Risk Score and Plan: 4 or greater and Treatment may vary due to age or medical condition, Ondansetron and Dexamethasone  Airway Management Planned: Oral ETT  Additional Equipment: None  Intra-op Plan:   Post-operative Plan: Extubation in OR  Informed Consent: I have reviewed the patients History and Physical, chart, labs and discussed the procedure including the risks, benefits and alternatives for the proposed anesthesia with the patient or authorized representative who has indicated his/her understanding and acceptance.     Dental advisory given  Plan Discussed with: CRNA  and Anesthesiologist  Anesthesia Plan Comments:         Anesthesia Quick Evaluation

## 2023-10-15 ENCOUNTER — Other Ambulatory Visit: Payer: Self-pay

## 2023-10-15 ENCOUNTER — Encounter (HOSPITAL_COMMUNITY): Payer: Self-pay | Admitting: Urology

## 2023-10-15 ENCOUNTER — Ambulatory Visit (HOSPITAL_BASED_OUTPATIENT_CLINIC_OR_DEPARTMENT_OTHER): Payer: PRIVATE HEALTH INSURANCE | Admitting: Certified Registered"

## 2023-10-15 ENCOUNTER — Ambulatory Visit (HOSPITAL_COMMUNITY): Payer: PRIVATE HEALTH INSURANCE

## 2023-10-15 ENCOUNTER — Observation Stay (HOSPITAL_COMMUNITY)
Admission: RE | Admit: 2023-10-15 | Discharge: 2023-10-18 | Disposition: A | Discharge status: Home / Self Care | Payer: PRIVATE HEALTH INSURANCE | Source: Ambulatory Visit | Attending: Urology | Admitting: Urology

## 2023-10-15 ENCOUNTER — Encounter (HOSPITAL_COMMUNITY)
Admission: RE | Disposition: A | Discharge status: Home / Self Care | Payer: Self-pay | Source: Ambulatory Visit | Attending: Urology

## 2023-10-15 ENCOUNTER — Ambulatory Visit (HOSPITAL_COMMUNITY): Payer: PRIVATE HEALTH INSURANCE | Admitting: Physician Assistant

## 2023-10-15 DIAGNOSIS — N200 Calculus of kidney: Principal | ICD-10-CM

## 2023-10-15 DIAGNOSIS — Z01818 Encounter for other preprocedural examination: Secondary | ICD-10-CM

## 2023-10-15 DIAGNOSIS — R519 Headache, unspecified: Principal | ICD-10-CM

## 2023-10-15 HISTORY — PX: NEPHROLITHOTOMY: SHX5134

## 2023-10-15 LAB — HEMOGLOBIN AND HEMATOCRIT, BLOOD
HCT: 34.9 % — ABNORMAL LOW (ref 36.0–46.0)
Hemoglobin: 11.2 g/dL — ABNORMAL LOW (ref 12.0–15.0)

## 2023-10-15 LAB — POCT PREGNANCY, URINE: Preg Test, Ur: NEGATIVE

## 2023-10-15 SURGERY — NEPHROLITHOTOMY PERCUTANEOUS
Anesthesia: General | Laterality: Right

## 2023-10-15 MED ORDER — DEXAMETHASONE SODIUM PHOSPHATE 10 MG/ML IJ SOLN
INTRAMUSCULAR | Status: AC
  Filled 2023-10-15: qty 1

## 2023-10-15 MED ORDER — ROCURONIUM BROMIDE 10 MG/ML (PF) SYRINGE
PREFILLED_SYRINGE | INTRAVENOUS | Status: AC
  Filled 2023-10-15: qty 10

## 2023-10-15 MED ORDER — HYDROMORPHONE HCL 1 MG/ML IJ SOLN: 0.5000 mg | INTRAMUSCULAR | Status: DC | PRN

## 2023-10-15 MED ORDER — CHLORHEXIDINE GLUCONATE 0.12 % MT SOLN
15.0000 mL | Freq: Once | OROMUCOSAL | Status: AC
  Administered 2023-10-15: 15 mL via OROMUCOSAL

## 2023-10-15 MED ORDER — FENTANYL CITRATE (PF) 100 MCG/2ML IJ SOLN
INTRAMUSCULAR | Status: AC
  Filled 2023-10-15: qty 2

## 2023-10-15 MED ORDER — OXYCODONE HCL 5 MG PO TABS: 5.0000 mg | ORAL_TABLET | Freq: Once | ORAL | Status: DC | PRN

## 2023-10-15 MED ORDER — ONDANSETRON HCL 4 MG/2ML IJ SOLN: 4.0000 mg | Freq: Once | INTRAMUSCULAR | Status: DC | PRN

## 2023-10-15 MED ORDER — OXYBUTYNIN CHLORIDE 5 MG PO TABS
5.0000 mg | ORAL_TABLET | Freq: Three times a day (TID) | ORAL | Status: DC | PRN
  Filled 2023-10-15: qty 1

## 2023-10-15 MED ORDER — HYDROMORPHONE HCL 1 MG/ML IJ SOLN: 0.2500 mg | INTRAMUSCULAR | Status: DC | PRN

## 2023-10-15 MED ORDER — ACETAMINOPHEN 10 MG/ML IV SOLN
1000.0000 mg | Freq: Once | INTRAVENOUS | Status: AC
  Administered 2023-10-15: 1000 mg via INTRAVENOUS

## 2023-10-15 MED ORDER — OXYCODONE HCL 5 MG/5ML PO SOLN: 5.0000 mg | Freq: Once | ORAL | Status: DC | PRN

## 2023-10-15 MED ORDER — ORAL CARE MOUTH RINSE
15.0000 mL | Freq: Once | OROMUCOSAL | Status: AC
Start: 2023-10-15 — End: 2023-10-15

## 2023-10-15 MED ORDER — PROPOFOL 10 MG/ML IV BOLUS
INTRAVENOUS | Status: DC | PRN
  Administered 2023-10-15: 200 mg via INTRAVENOUS

## 2023-10-15 MED ORDER — DEXAMETHASONE SODIUM PHOSPHATE 10 MG/ML IJ SOLN
INTRAMUSCULAR | Status: DC | PRN
  Administered 2023-10-15: 8 mg via INTRAVENOUS

## 2023-10-15 MED ORDER — SODIUM CHLORIDE 0.9 % IR SOLN
Status: DC | PRN
  Administered 2023-10-15: 21000 mL

## 2023-10-15 MED ORDER — PROPOFOL 10 MG/ML IV BOLUS
INTRAVENOUS | Status: AC
  Filled 2023-10-15: qty 20

## 2023-10-15 MED ORDER — ACETAMINOPHEN 10 MG/ML IV SOLN
INTRAVENOUS | Status: AC
  Filled 2023-10-15: qty 100

## 2023-10-15 MED ORDER — MIDAZOLAM HCL 2 MG/2ML IJ SOLN
INTRAMUSCULAR | Status: AC
  Filled 2023-10-15: qty 2

## 2023-10-15 MED ORDER — SODIUM CHLORIDE 0.9 % IV SOLN
2.0000 g | INTRAVENOUS | Status: AC
  Administered 2023-10-15: 2 g via INTRAVENOUS
  Filled 2023-10-15: qty 12.5

## 2023-10-15 MED ORDER — ACETAMINOPHEN 500 MG PO TABS
1000.0000 mg | ORAL_TABLET | Freq: Three times a day (TID) | ORAL | Status: AC
  Administered 2023-10-16 (×2): 1000 mg via ORAL
  Filled 2023-10-15 (×5): qty 2

## 2023-10-15 MED ORDER — ONDANSETRON HCL 4 MG/2ML IJ SOLN
INTRAMUSCULAR | Status: AC
  Filled 2023-10-15: qty 2

## 2023-10-15 MED ORDER — LIDOCAINE HCL (CARDIAC) PF 100 MG/5ML IV SOSY
PREFILLED_SYRINGE | INTRAVENOUS | Status: DC | PRN
  Administered 2023-10-15: 60 mg via INTRAVENOUS

## 2023-10-15 MED ORDER — OXYCODONE HCL 5 MG PO TABS: 5.0000 mg | ORAL_TABLET | ORAL | Status: DC | PRN

## 2023-10-15 MED ORDER — ROCURONIUM BROMIDE 100 MG/10ML IV SOLN
INTRAVENOUS | Status: DC | PRN
  Administered 2023-10-15: 10 mg via INTRAVENOUS
  Administered 2023-10-15: 20 mg via INTRAVENOUS
  Administered 2023-10-15: 60 mg via INTRAVENOUS
  Administered 2023-10-15 (×2): 20 mg via INTRAVENOUS

## 2023-10-15 MED ORDER — SENNOSIDES-DOCUSATE SODIUM 8.6-50 MG PO TABS
1.0000 | ORAL_TABLET | Freq: Two times a day (BID) | ORAL | Status: DC
  Administered 2023-10-15 – 2023-10-16 (×3): 1 via ORAL
  Filled 2023-10-15 (×4): qty 1

## 2023-10-15 MED ORDER — FENTANYL CITRATE (PF) 100 MCG/2ML IJ SOLN
INTRAMUSCULAR | Status: DC | PRN
  Administered 2023-10-15: 100 ug via INTRAVENOUS
  Administered 2023-10-15 (×2): 25 ug via INTRAVENOUS

## 2023-10-15 MED ORDER — SUGAMMADEX SODIUM 200 MG/2ML IV SOLN
INTRAVENOUS | Status: DC | PRN
  Administered 2023-10-15: 150 mg via INTRAVENOUS

## 2023-10-15 MED ORDER — IOHEXOL 300 MG/ML  SOLN
INTRAMUSCULAR | Status: DC | PRN
Start: 2023-10-15 — End: 2023-10-15
  Administered 2023-10-15: 50 mL

## 2023-10-15 MED ORDER — LACTATED RINGERS IV SOLN: INTRAVENOUS | Status: DC

## 2023-10-15 MED ORDER — ONDANSETRON HCL 4 MG/2ML IJ SOLN
INTRAMUSCULAR | Status: DC | PRN
  Administered 2023-10-15: 4 mg via INTRAVENOUS

## 2023-10-15 SURGICAL SUPPLY — 75 items
AGENT HMST KT MTR STRL THRMB (HEMOSTASIS)
APL PRP STRL LF DISP 70% ISPRP (MISCELLANEOUS) ×1
APL SKNCLS STERI-STRIP NONHPOA (GAUZE/BANDAGES/DRESSINGS) ×1
BAG COUNTER SPONGE SURGICOUNT (BAG) IMPLANT
BAG DRN RND TRDRP ANRFLXCHMBR (UROLOGICAL SUPPLIES)
BAG SPNG CNTER NS LX DISP (BAG)
BAG URINE DRAIN 2000ML AR STRL (UROLOGICAL SUPPLIES) IMPLANT
BAG URO CATCHER STRL LF (MISCELLANEOUS) IMPLANT
BASKET LASER NITINOL 1.9FR (BASKET) IMPLANT
BASKET STONE NCOMPASS (UROLOGICAL SUPPLIES) IMPLANT
BASKET ZERO TIP NITINOL 2.4FR (BASKET) IMPLANT
BENZOIN TINCTURE PRP APPL 2/3 (GAUZE/BANDAGES/DRESSINGS) ×1 IMPLANT
BINDER ABDOMINAL 12 ML 46-62 (SOFTGOODS) IMPLANT
BLADE SURG 15 STRL LF DISP TIS (BLADE) ×1 IMPLANT
BLADE SURG 15 STRL SS (BLADE) ×1
BSKT STON RTRVL 120 1.9FR (BASKET)
BSKT STON RTRVL ZERO TP 2.4FR (BASKET)
CATH FOLEY 2W COUNCIL 20FR 5CC (CATHETERS) IMPLANT
CATH FOLEY 2WAY SLVR 5CC 16FR (CATHETERS) ×1 IMPLANT
CATH MULTI PURPOSE 16FR DRAIN (CATHETERS) IMPLANT
CATH ROBINSON RED A/P 20FR (CATHETERS) IMPLANT
CATH ULTRATHANE 14FR (CATHETERS) IMPLANT
CATH URETL OPEN END 6FR 70 (CATHETERS) IMPLANT
CATH UROLOGY TORQUE 40 (MISCELLANEOUS) IMPLANT
CATH X-FORCE N30 NEPHROSTOMY (TUBING) IMPLANT
CHLORAPREP W/TINT 26 (MISCELLANEOUS) ×2 IMPLANT
COVER BACK TABLE 60X90IN (DRAPES) ×1 IMPLANT
DRAPE C-ARM 42X120 X-RAY (DRAPES) ×1 IMPLANT
DRAPE LINGEMAN PERC (DRAPES) ×1 IMPLANT
DRAPE SHEET LG 3/4 BI-LAMINATE (DRAPES) IMPLANT
DRAPE SURG IRRIG POUCH 19X23 (DRAPES) ×1 IMPLANT
DRSG TEGADERM 4X4.75 (GAUZE/BANDAGES/DRESSINGS) IMPLANT
DRSG TEGADERM 8X12 (GAUZE/BANDAGES/DRESSINGS) ×2 IMPLANT
GAUZE PAD ABD 8X10 STRL (GAUZE/BANDAGES/DRESSINGS) ×2 IMPLANT
GAUZE SPONGE 4X4 12PLY STRL (GAUZE/BANDAGES/DRESSINGS) ×1 IMPLANT
GLOVE SURG LX STRL 7.5 STRW (GLOVE) ×1 IMPLANT
GOWN STRL REUS W/ TWL XL LVL3 (GOWN DISPOSABLE) ×1 IMPLANT
GOWN STRL REUS W/TWL XL LVL3 (GOWN DISPOSABLE) ×1
GUIDEWIRE AMPLAZ .035X145 (WIRE) ×2 IMPLANT
GUIDEWIRE ANG ZIPWIRE 038X150 (WIRE) ×1 IMPLANT
GUIDEWIRE STR DUAL SENSOR (WIRE) IMPLANT
IV SET EXTENSION CATH 6 NF (IV SETS) IMPLANT
KIT BASIN OR (CUSTOM PROCEDURE TRAY) ×1 IMPLANT
KIT PROBE 340X3.4XDISP GRN (MISCELLANEOUS) IMPLANT
KIT PROBE TRILOGY 3.4X340 (MISCELLANEOUS)
KIT PROBE TRILOGY 3.9X350 (MISCELLANEOUS) IMPLANT
LASER FIB FLEXIVA PULSE ID 365 (Laser) IMPLANT
LASER FIB FLEXIVA PULSE ID 910 (Laser) IMPLANT
LUBRICANT JELLY K Y 4OZ (MISCELLANEOUS) ×1 IMPLANT
MANIFOLD NEPTUNE II (INSTRUMENTS) ×1 IMPLANT
NDL TROCAR 18X15 ECHO (NEEDLE) IMPLANT
NDL TROCAR 18X20 (NEEDLE) IMPLANT
NEEDLE TROCAR 18X15 ECHO (NEEDLE)
NEEDLE TROCAR 18X20 (NEEDLE)
NS IRRIG 1000ML POUR BTL (IV SOLUTION) ×1 IMPLANT
PACK CYSTO (CUSTOM PROCEDURE TRAY) IMPLANT
SHEATH PEELAWAY SET 9 (SHEATH) IMPLANT
SPONGE T-LAP 4X18 ~~LOC~~+RFID (SPONGE) ×1 IMPLANT
SURGIFLO W/THROMBIN 8M KIT (HEMOSTASIS) IMPLANT
SUT SILK 2 0 30 PSL (SUTURE) ×1 IMPLANT
SUT VIC AB 2-0 CT1 27 (SUTURE)
SUT VIC AB 2-0 CT1 TAPERPNT 27 (SUTURE) IMPLANT
SYR 10ML LL (SYRINGE) ×1 IMPLANT
SYR 20ML LL LF (SYRINGE) ×2 IMPLANT
SYR 50ML LL SCALE MARK (SYRINGE) ×1 IMPLANT
TOWEL OR 17X26 10 PK STRL BLUE (TOWEL DISPOSABLE) ×1 IMPLANT
TRACTIP FLEXIVA PULS ID 200XHI (Laser) IMPLANT
TRACTIP FLEXIVA PULSE ID 200 (Laser)
TRAY FOLEY MTR SLVR 16FR STAT (SET/KITS/TRAYS/PACK) ×1 IMPLANT
TUBE CONNECTING VINYL 14FR 30C (TUBING) IMPLANT
TUBING CONNECTING 10 (TUBING) ×2 IMPLANT
TUBING STONE CATCHER TRILOGY (MISCELLANEOUS) IMPLANT
TUBING UROLOGY SET (TUBING) IMPLANT
WATER STERILE IRR 1000ML POUR (IV SOLUTION) ×1 IMPLANT
WATER STERILE IRR 3000ML UROMA (IV SOLUTION) ×1 IMPLANT

## 2023-10-15 NOTE — Progress Notes (Addendum)
Contacted portable equipment for breast pump at the request of the patient.  [employer] told that it was "out" and they would try to locate it.  Will update patient on information given from portable  Patient stated she does have pump from home with her  7:47 AM Left message for the lactation consultants at [facility] will await call back   7:56 AM Dot Lanes from PACU updated   8:40 AM Lactation called back, suggested that family go home to get pump at home.  We will call portable again to follow up with them on the pump that is in house.   8:48 AM Spoke to patient, breast pump is at bedside but patient does not have charger.  Encouraged patient to have family go home and get charger if possible.  Patient also asking if we could get a crib for her 14 month old while she is admitted.  I told patient that I can ask but I'm not sure if that will be possible but I would do my best   Spoke with [facility] who is aware of the breast pump request and she stated she would get back to me.  9:09 AM Spoke with [employer] again,  the breast pump is currently in use with another patient.  They are going to ask if the patient is still using the pump and get back with [place].    9:14 AM [employer] returned call, they only have a manual pump available.  We can discuss with patient after surgery if she wants the manual pump.  9:14 AM  Called [facility] to help locate a crib no answer will try again  9:28 AM AC at [facility] called she suggested to discuss crib with [facility] to get that send over   9:36 AM  [facility] called and is going to work on getting a breast pump and tubbing here for patient during her stay she stated that she will also work on getting a crib sent over.  I told her to have the [employer] call me when those items arrive and I will get them to the patient.   0940 AM  Discussed with patients husband that we are working on getting a pump and crib here at the hospital and that when the  baby is present in the room that a responsible must remain with the baby at all times and that does not include the patient. Patients husband verified understanding.   10:33 AM AC at [facility] called back to let us know the pump should be coming over soon and that she is still working on the crib but will keep [place] posted.  Dot Lanes in PACU updated  10:42 AM Breast pump delivered and taken to PACU.  Charge RN [employer] aware.  Patients family updated that breast pump is on site  12:37 PM [facility], she has not heard back on crib, she will call me back once she calls for crib again  1:07 PM [facility] called back requesting room number for patient.  1:49 PM called [facility], crib has been delivered, per Patients RN

## 2023-10-15 NOTE — Transfer of Care (Signed)
Immediate Anesthesia Transfer of Care Note  Patient: Amber Daugherty  Procedure(s) Performed: RIGHT NEPHROLITHOTOMY PERCUTANEOUS FIRST STAGE (Right)  Patient Location: PACU  Anesthesia Type:General  Level of Consciousness: awake, alert , and patient cooperative  Airway & Oxygen Therapy: Patient Spontanous Breathing and Patient connected to face mask oxygen  Post-op Assessment: Report given to RN and Post -op Vital signs reviewed and stable  Post vital signs: Reviewed and stable  Last Vitals:  Vitals Value Taken Time  BP 114/55 10/15/23 1132  Temp 36.6 C 10/15/23 1132  Pulse 80 10/15/23 1137  Resp 18 10/15/23 1137  SpO2 100 % 10/15/23 1137  Vitals shown include unfiled device data.  Last Pain:  Vitals:   10/15/23 1132  TempSrc:   PainSc: 0-No pain         Complications: No notable events documented.

## 2023-10-15 NOTE — Anesthesia Procedure Notes (Signed)
Procedure Name: Intubation Date/Time: 10/15/2023 9:15 AM  Performed by: Sindy Guadeloupe, CRNAPre-anesthesia Checklist: Patient identified, Emergency Drugs available, Suction available, Patient being monitored and Timeout performed Patient Re-evaluated:Patient Re-evaluated prior to induction Oxygen Delivery Method: Circle system utilized Preoxygenation: Pre-oxygenation with 100% oxygen Induction Type: IV induction Ventilation: Mask ventilation without difficulty Laryngoscope Size: Mac and 4 Grade View: Grade I Tube type: Oral Tube size: 7.0 mm Number of attempts: 1 Airway Equipment and Method: Stylet Placement Confirmation: ETT inserted through vocal cords under direct vision, positive ETCO2 and breath sounds checked- equal and bilateral Secured at: 21 cm Tube secured with: Tape Dental Injury: Teeth and Oropharynx as per pre-operative assessment

## 2023-10-15 NOTE — Op Note (Signed)
Amber, Daugherty MEDICAL RECORD NO: [id number] ACCOUNT NO: [id number] DATE OF BIRTH: [birth year] FACILITY: [facility] LOCATION: [facility] PHYSICIAN: Sebastian Ache, MD  Operative Report   DATE OF PROCEDURE: 10/15/2023  PREOPERATIVE DIAGNOSIS:  Multifocal large volume right renal stones and ureteral stones.  PROCEDURE PERFORMED: 1.  Right first stage percutaneous nephrostolithotomy stone greater than 2 cm. 2.  Right antegrade nephrostogram interpretation. 3.  Exchange of right nephrostomy tube.  ESTIMATED BLOOD LOSS:  50 mL.  COMPLICATIONS:  None.  SPECIMEN:  Renal stone fragments for composition analysis.  FINDINGS:   1.  Large volume intrarenal stones well over 3 cm. 2.  Excellent sheath placement into the lower mid calix as per the prior nephrostomy tube access. 3.  Complete resolution of all accessible renal stone component larger than one-third mm. There does appear to be one additional fragment possibly an acutely angled calix not accessible with rigid technique today. 4.  Successful replacement of right nephrostomy tube in the right nephroureteral stent.  INDICATIONS:  The patient is a very pleasant 37 year old lady who was found during the second half of the recent pregnancy to have a very large volume right renal and ureteral stones.  She underwent nephrostomy tube placement at that time has been  temporized during the last portion of her pregnancy with nephrostomy tube exchange every 4 to 6 weeks, which she has done commendably well with.  She delivered a healthy child several weeks ago and as per our prior plan we discussed the management of her  right stones with percutaneous proximal lithotomy in a staged fashion.  She presents for first stage today.  Informed consent was obtained and placed in medical record.  PROCEDURE IN DETAIL:  The patient being identified and verified.  Procedure being right first stage percutaneous nephrolithotomy was confirmed.  Procedure timeout was  performed.  Intravenous antibiotics were administered.  General endotracheal anesthesia  induced.  The patient was placed into a prone position after Foley catheter was placed per urethra to straight drain. Prone view, chest rolls, axillary rolls padding of her knees, ankles, elbows carefully performed. Her in situ right nephrostomy tube  was prepped and draped into a sterile field using chlorhexidine gluconate.  Spot fluoroscopic imaging revealed good placement of the nephrostomy tube in the lower mid access and large volume right intrarenal stones as anticipated. Nephrostogram confirmed  this.  There was moderate hydronephrosis.  A ZIPwire was advanced via the nephrostomy tube close to the level of the kidney.  Nephrostomy tube was removed and ZIPwire was navigated down the ureter via a KMP catheter to the level of the bladder and  exchanged for a Super Stiff wire via the KMP catheter.  Next, a dual lumen introducer was advanced to the level of proximal ureter.  A ZIPwire was advanced along this and exchanged for a second Super Stiff wire via the KMP catheter having obtained two  Super Stiff wire access via access tracts level of the bladder.  An incision was made in the right flank at the same side for approximately 2 cm.  The 30-French NephroMax balloon dilation apparatus was very carefully advanced across the access site in  lower pole calix inflated to a pressure of 20 atmospheres, held for 90 seconds and sheath was very carefully advanced under fluoroscopic guidance.  Rigid nephroscopy was then performed, which revealed excellent sheath placement.  This allowed excellent  nonacute angulation to the majority of her renal stone, which was mostly in the renal pelvis.  There was a  very large volume multifocal stones well over 3 cm.  Some of the stones were amenable to simple rigid endoscopic retrieval.  One dominant, very  large stone that in itself was greater than 2 cm reports not amenable to this.   Initial plan was made to use the lithoclast and dual ultrasound pneumatic device for this.  However, this equipment was minimally functional from its pneumatic function and  had zero functionality of the ultrasound, and intermitant / minimal pneumatic function, some time was spent troubleshooting this.  Initial handpiece was tried; however, this remained nonfunctional.  Therefore, resorted to a somewhat more tedious, but still very safe laser lithotripsy technique using a  900 nanometer fiber instilled with rigid nephroscopy initially at settings of 0.3 joules and 30 Hz and dusting technique was used.  The stone was quite soft.  Initial fragmentation was performed as well using more fragmentation settings of 1 joule and 15  Hz.  This nearly generated innumerable renal stone fragments then unfortunately, the lithoclast device was not easily suctionable.  Therefore sequential basketing was performed using an encompass basket removing estimated 95% of the volume of this  stone.  This was somewhat tedious, but quite safe.  Following this, we removed all accessible stone fragments via the rigid technique, which was the goal of first staged procedure today.  Spot fluoroscopic imaging revealed resolution of all expected  renal stone fragments; however, there was one area concern that may represent another stone and a very acutely angled calix.  This will be much safer to address at next stage with the sheath not present. As such, a 16-French nephrostomy tube was advanced  over one of the Super Stiff wires to the level of the renal pelvis coiled in place.  Sheath was removed.  A KMP catheter advanced down the second Super Stiff wire to the level of the bladder acting as an externalized nephroureteral stent.  This was  capped.  It was sutured in place with interrupted silk.  Percutaneous drape was applied.  After final nephrostogram corroborated excellent tube placement.  No evidence of renal perforation.  Procedure  was terminated.  The patient tolerated procedure  well, no immediate perioperative complications.  The patient taken to postanesthesia care unit in stable condition.  Plan for observation admission and likely second stage procedure day after tomorrow as planned with goal of rendering her completely  stone free.   PUS D: 10/15/2023 11:30:20 am T: 10/15/2023 12:10:00 pm  JOB: [id number]/ [id number]

## 2023-10-15 NOTE — H&P (Signed)
Amber Daugherty is an 37 y.o. female.    Chief Complaint: Pre-OP RIGHT Percutantoues Nephrostolithotomy  HPI:   1 - Multifocal Large Right Renal / Ureteral Stones in 3rd Trimester Pregnancy - Large volume Rt renal stones (abtou 3cm total) + distal ureteral stones by CT on eval hydro, flank pain, recurrent UTI. Neph tube placed initiall 05/2023 as temporizing measure with plan for percutaneous nephrsotolithotomy abtou a month after she delivers. Last neph tube exchange 9/23 (had been malpositioned most recently previously) and delivery of healthy shild few days later.   ??2 - Recurrent Urinary Tract Infections - recurrent cystitis per report. On cephalexin per GYN during pregnany. Large likely colonized rt stones as per above. UCX 08/2023 Serratia sens to cefepime, cipro, gent. ?  ?PMH sig for IVF x1, lap ablation endometriosis. NO CV disease / blood thinners. Her PCP is Sidonie Dickens PA with [facility]. Her primary GYN is Ivonne Andrew CNM. She is related to [employer] [place]. ?  ?Today " Amber Daugherty " is seen to proceed with RIGHT 1st stage PCNL. NO interval fevers. Most recetn UCX from out office non-clonal. Has been on few days cipro to reduce colonization. Hgb 11.7, Cr 0.87 most recetnly.    Past Medical History:  Diagnosis Date   Anemia    Family history of adverse reaction to anesthesia    mother- N/V   History of kidney stones    Irritable bowel syndrome (IBS)    Mitral valve prolapse    Pt reported     Past Surgical History:  Procedure Laterality Date   ADENOIDECTOMY  1989   IR NEPHROSTOMY EXCHANGE RIGHT  07/14/2023   IR NEPHROSTOMY EXCHANGE RIGHT  08/11/2023   IR NEPHROSTOMY EXCHANGE RIGHT  09/08/2023   IR NEPHROSTOMY PLACEMENT RIGHT  06/15/2023   LAPAROSCOPY  07/2017    History reviewed. No pertinent family history. Social History:  reports that she has never smoked. She has never used smokeless tobacco. She reports that she does not drink alcohol and does not use  drugs.  Allergies:  Allergies  Allergen Reactions   Sulfa Antibiotics Hives   Amoxicillin Rash   Ketoconazole Rash    Medications Prior to Admission  Medication Sig Dispense Refill   ferrous sulfate 325 (65 FE) MG tablet Take 325 mg by mouth in the morning and at bedtime.     Prenatal Vit-Fe Fumarate-FA (PRENATAL MULTIVITAMIN) TABS tablet Take 1 tablet by mouth daily.     acetaminophen (TYLENOL) 325 MG tablet Take 2 tablets (650 mg total) by mouth every 4 (four) hours as needed (for pain scale < 4). (Patient not taking: Reported on 09/26/2023)     ertapenem 1 g in sodium chloride 0.9 % 100 mL Inject 1 g into the vein daily. (Patient not taking: Reported on 09/26/2023) 1 g 7   ibuprofen (ADVIL) 600 MG tablet Take 1 tablet (600 mg total) by mouth every 6 (six) hours. (Patient not taking: Reported on 09/26/2023) 30 tablet 0   oxyCODONE (ROXICODONE) 5 MG immediate release tablet Take 1 tablet (5 mg total) by mouth every 6 (six) hours as needed for severe pain. (Patient not taking: Reported on 09/26/2023) 10 tablet 0    No results found for this or any previous visit (from the past 48 hour(s)). No results found.  Review of Systems  Constitutional:  Negative for chills and fever.  All other systems reviewed and are negative.   Blood pressure 113/69, pulse 83, temperature 98.1 F (36.7 C),  temperature source Oral, resp. rate 16, height 5\' 6"  (1.676 m), weight 72.6 kg, SpO2 96%, currently breastfeeding. Physical Exam Vitals reviewed.  HENT:     Head: Normocephalic.     Nose: Nose normal.  Eyes:     Pupils: Pupils are equal, round, and reactive to light.  Cardiovascular:     Rate and Rhythm: Normal rate.  Pulmonary:     Effort: Pulmonary effort is normal.  Abdominal:     General: Abdomen is flat.  Genitourinary:    Comments: Rt neph tueb with non-foul urine Musculoskeletal:        General: Normal range of motion.     Cervical back: Normal range of motion.  Skin:    General:  Skin is warm.  Neurological:     General: No focal deficit present.     Mental Status: She is alert.  Psychiatric:        Mood and Affect: Mood normal.      Assessment/Plan  Proceed as planned with RIGHT 1st stage PCNL. Risks, benefits, alternatives, expected peri-op course discussed previously and reiterated today.   Loletta Parish., MD 10/15/2023, 8:06 AM

## 2023-10-15 NOTE — Brief Op Note (Signed)
10/15/2023  11:21 AM  PATIENT:  Waunita Schooner  37 y.o. female  PRE-OPERATIVE DIAGNOSIS:  RIGHT STAGHORN KIDNEY STONE  POST-OPERATIVE DIAGNOSIS:  RIGHT STAGHORN KIDNEY STONE  PROCEDURE:  Procedure(s) with comments: RIGHT NEPHROLITHOTOMY PERCUTANEOUS FIRST STAGE (Right) - 2.5 HRS FOR CASE  SURGEON:  Surgeons and Role:    * Rabiah Goeser, Delbert Phenix., MD - Primary  PHYSICIAN ASSISTANT:   ASSISTANTS: none   ANESTHESIA:   general  EBL:  50 mL   BLOOD ADMINISTERED:none  DRAINS:  Foley to gravity; Rt neph tube to gravity    LOCAL MEDICATIONS USED:  NONE  SPECIMEN:  Source of Specimen:  Rt renal stone fragments  DISPOSITION OF SPECIMEN:   [facility] for compositional analysis  COUNTS:  YES  TOURNIQUET:  * No tourniquets in log *  DICTATION: .Other Dictation: Dictation Number [id number]  PLAN OF CARE: Admit for overnight observation  PATIENT DISPOSITION:  PACU - hemodynamically stable.   Delay start of Pharmacological VTE agent (>24hrs) due to surgical blood loss or risk of bleeding: yes

## 2023-10-15 NOTE — Anesthesia Postprocedure Evaluation (Signed)
Anesthesia Post Note  Patient: Wealtha Domingo  Procedure(s) Performed: RIGHT NEPHROLITHOTOMY PERCUTANEOUS FIRST STAGE (Right)     Patient location during evaluation: PACU Anesthesia Type: General Level of consciousness: awake and alert and oriented Pain management: pain level controlled Vital Signs Assessment: post-procedure vital signs reviewed and stable Respiratory status: spontaneous breathing, nonlabored ventilation and respiratory function stable Cardiovascular status: blood pressure returned to baseline and stable Postop Assessment: no apparent nausea or vomiting Anesthetic complications: no   No notable events documented.  Last Vitals:  Vitals:   10/15/23 1145 10/15/23 1200  BP: (!) 109/53 109/71  Pulse: 72 66  Resp: 15 15  Temp:    SpO2: 96% 97%    Last Pain:  Vitals:   10/15/23 1200  TempSrc:   PainSc: 0-No pain                 Keanen Dohse A.

## 2023-10-15 NOTE — Discharge Instructions (Signed)
1 - You may have urinary urgency (bladder spasms) and bloody urine on / off with stent in place. This is normal. ° °2 - Call MD or go to ER for fever >102, severe pain / nausea / vomiting not relieved by medications, or acute change in medical status ° °

## 2023-10-15 NOTE — Plan of Care (Signed)
  Problem: Nutrition: Goal: Adequate nutrition will be maintained Outcome: Progressing   Problem: Elimination: Goal: Will not experience complications related to urinary retention Outcome: Progressing   Problem: Pain Management: Goal: General experience of comfort will improve Outcome: Progressing

## 2023-10-15 NOTE — OR Nursing (Signed)
POCT Hcg Negative 10/14/20 @ 0757

## 2023-10-16 ENCOUNTER — Encounter (HOSPITAL_COMMUNITY): Payer: Self-pay | Admitting: Urology

## 2023-10-16 DIAGNOSIS — N200 Calculus of kidney: Secondary | ICD-10-CM | POA: Diagnosis not present

## 2023-10-16 LAB — BASIC METABOLIC PANEL
Anion gap: 6 (ref 5–15)
BUN: 22 mg/dL — ABNORMAL HIGH (ref 6–20)
CO2: 24 mmol/L (ref 22–32)
Calcium: 8.6 mg/dL — ABNORMAL LOW (ref 8.9–10.3)
Chloride: 106 mmol/L (ref 98–111)
Creatinine, Ser: 0.68 mg/dL (ref 0.44–1.00)
GFR, Estimated: >60 mL/min (ref >60)
Glucose, Bld: 112 mg/dL — ABNORMAL HIGH (ref 70–99)
Potassium: 3.3 mmol/L — ABNORMAL LOW (ref 3.5–5.1)
Sodium: 136 mmol/L (ref 135–145)

## 2023-10-16 LAB — HEMOGLOBIN AND HEMATOCRIT, BLOOD
HCT: 33.7 % — ABNORMAL LOW (ref 36.0–46.0)
Hemoglobin: 10.7 g/dL — ABNORMAL LOW (ref 12.0–15.0)

## 2023-10-16 MED ORDER — CHLORHEXIDINE GLUCONATE CLOTH 2 % EX PADS
6.0000 | MEDICATED_PAD | Freq: Every day | CUTANEOUS | Status: DC
  Administered 2023-10-16: 6 via TOPICAL

## 2023-10-16 MED ORDER — ACETAMINOPHEN 500 MG PO TABS
1000.0000 mg | ORAL_TABLET | Freq: Once | ORAL | Status: DC
Start: 2023-10-16 — End: 2023-10-16

## 2023-10-16 MED ORDER — ORAL CARE MOUTH RINSE: 15.0000 mL | OROMUCOSAL | Status: DC | PRN

## 2023-10-16 MED ORDER — ACETAMINOPHEN 500 MG PO TABS
1000.0000 mg | ORAL_TABLET | Freq: Four times a day (QID) | ORAL | Status: DC | PRN
  Administered 2023-10-16: 1000 mg via ORAL

## 2023-10-16 NOTE — Plan of Care (Signed)
  Problem: Education: Goal: Knowledge of General Education information will improve Description: Including pain rating scale, medication(s)/side effects and non-pharmacologic comfort measures Outcome: Completed/Met   Problem: Clinical Measurements: Goal: Respiratory complications will improve Outcome: Completed/Met Goal: Cardiovascular complication will be avoided Outcome: Completed/Met   Problem: Nutrition: Goal: Adequate nutrition will be maintained Outcome: Completed/Met   Problem: Coping: Goal: Level of anxiety will decrease Outcome: Completed/Met   Problem: Safety: Goal: Ability to remain free from injury will improve Outcome: Completed/Met

## 2023-10-16 NOTE — Plan of Care (Signed)
  Problem: Education: Goal: Knowledge of General Education information will improve Description: Including pain rating scale, medication(s)/side effects and non-pharmacologic comfort measures Outcome: Progressing   Problem: Coping: Goal: Level of anxiety will decrease Outcome: Progressing   Problem: Elimination: Goal: Will not experience complications related to urinary retention Outcome: Progressing   Problem: Pain Management: Goal: General experience of comfort will improve Outcome: Progressing

## 2023-10-16 NOTE — Anesthesia Preprocedure Evaluation (Addendum)
Anesthesia Evaluation  Patient identified by MRN, date of birth, ID band Patient awake    Reviewed: Allergy & Precautions, NPO status , Patient's Chart, lab work & pertinent test results  History of Anesthesia Complications (+) Family history of anesthesia reaction  Airway Mallampati: II  TM Distance: >3 FB     Dental  (+) Dental Advisory Given, Teeth Intact   Pulmonary neg pulmonary ROS   breath sounds clear to auscultation       Cardiovascular negative cardio ROS  Rhythm:Regular Rate:Normal     Neuro/Psych  PSYCHIATRIC DISORDERS      ADHDnegative neurological ROS     GI/Hepatic negative GI ROS, Neg liver ROS,,,  Endo/Other  negative endocrine ROS    Renal/GU Renal diseaseRight staghorn calculus S/P percutaneous nephrostomy tube  negative genitourinary   Musculoskeletal negative musculoskeletal ROS (+)    Abdominal   Peds  Hematology  (+) Blood dyscrasia, anemia Alpha thalassemia trait   Anesthesia Other Findings   Reproductive/Obstetrics Lactating                              Anesthesia Physical Anesthesia Plan  ASA: 2  Anesthesia Plan: General   Post-op Pain Management: Precedex, Dilaudid IV and Ofirmev IV (intra-op)*   Induction: Intravenous  PONV Risk Score and Plan: 4 or greater and Treatment may vary due to age or medical condition, Ondansetron and Dexamethasone  Airway Management Planned: Oral ETT  Additional Equipment: None  Intra-op Plan:   Post-operative Plan: Extubation in OR  Informed Consent: I have reviewed the patients History and Physical, chart, labs and discussed the procedure including the risks, benefits and alternatives for the proposed anesthesia with the patient or authorized representative who has indicated his/her understanding and acceptance.     Dental advisory given  Plan Discussed with: CRNA  Anesthesia Plan Comments:          Anesthesia Quick Evaluation

## 2023-10-16 NOTE — Progress Notes (Signed)
1 Day Post-Op   Subjective/Chief Complaint:  1 - Multifocal Large Volume Rt Renal / Ureteral Stones - s/p RIGHT 1st stage PCNL 10/15/23 for large volume stones. Plan for 2nd stage 11/1.   Today "Amber Daugherty" is stable. Pain controlled. No drain problems. Hgb 11, Cr 0.68.    Objective: Vital signs in last 24 hours: Temp:  [97.6 F (36.4 C)-98.2 F (36.8 C)] 97.7 F (36.5 C) (10/31 0907) Pulse Rate:  [67-80] 79 (10/31 0907) Resp:  [15-16] 16 (10/31 0907) BP: (102-114)/(61-70) 102/64 (10/31 0907) SpO2:  [98 %-99 %] 98 % (10/31 0907) Last BM Date : 10/15/23  Intake/Output from previous day: 10/30 0701 - 10/31 0700 In: 2000 [P.O.:1080; I.V.:720; IV Piggyback:200] Out: 2825 [Urine:2725; Blood:100] Intake/Output this shift: Total I/O In: -  Out: 2650 [Urine:2650]  NAD with baby in arms. Husband at bedside.  Non-labored breathing on RA SNTND Rt flank neph tube c/d/I, no hematomas, non-foul urien per neph tueb and foley No c/c/e  Lab Results:  Recent Labs    10/15/23 1150 10/16/23 0502  HGB 11.2* 10.7*  HCT 34.9* 33.7*   BMET Recent Labs    10/16/23 0502  NA 136  K 3.3*  CL 106  CO2 24  GLUCOSE 112*  BUN 22*  CREATININE 0.68  CALCIUM 8.6*   PT/INR No results for input(s): "LABPROT", "INR" in the last 72 hours. ABG No results for input(s): "PHART", "HCO3" in the last 72 hours.  Invalid input(s): "PCO2", "PO2"  Studies/Results: DG C-Arm 1-60 Min-No Report  Result Date: 10/15/2023 Fluoroscopy was utilized by the requesting physician.  No radiographic interpretation.   DG C-Arm 1-60 Min-No Report  Result Date: 10/15/2023 Fluoroscopy was utilized by the requesting physician.  No radiographic interpretation.    Anti-infectives: Anti-infectives (From admission, onward)    Start     Dose/Rate Route Frequency Ordered Stop   10/15/23 0745  ceFEPIme (MAXIPIME) 2 g in sodium chloride 0.9 % 100 mL IVPB        2 g 200 mL/hr over 30 Minutes Intravenous On call to  O.R. 10/15/23 0740 10/15/23 0936       Assessment/Plan:  Doing well POD 1 s/p 1st stage PCNL. NPO p MN in preparation for 2nd stag PCNL tomorrow late AM.    Loletta Parish. 10/16/2023

## 2023-10-17 ENCOUNTER — Observation Stay (HOSPITAL_COMMUNITY): Payer: PRIVATE HEALTH INSURANCE

## 2023-10-17 ENCOUNTER — Encounter (HOSPITAL_COMMUNITY)
Admission: RE | Disposition: A | Discharge status: Home / Self Care | Payer: Self-pay | Source: Ambulatory Visit | Attending: Urology

## 2023-10-17 ENCOUNTER — Encounter (HOSPITAL_COMMUNITY): Payer: Self-pay | Admitting: Urology

## 2023-10-17 ENCOUNTER — Inpatient Hospital Stay (HOSPITAL_COMMUNITY): Admission: RE | Admit: 2023-10-17 | Payer: PRIVATE HEALTH INSURANCE | Source: Home / Self Care | Admitting: Urology

## 2023-10-17 ENCOUNTER — Observation Stay (HOSPITAL_BASED_OUTPATIENT_CLINIC_OR_DEPARTMENT_OTHER): Payer: PRIVATE HEALTH INSURANCE | Admitting: Anesthesiology

## 2023-10-17 ENCOUNTER — Observation Stay (HOSPITAL_COMMUNITY): Payer: PRIVATE HEALTH INSURANCE | Admitting: Anesthesiology

## 2023-10-17 ENCOUNTER — Other Ambulatory Visit (HOSPITAL_COMMUNITY): Payer: Self-pay

## 2023-10-17 ENCOUNTER — Other Ambulatory Visit: Payer: Self-pay

## 2023-10-17 DIAGNOSIS — N200 Calculus of kidney: Secondary | ICD-10-CM | POA: Diagnosis not present

## 2023-10-17 HISTORY — PX: NEPHROLITHOTOMY: SHX5134

## 2023-10-17 SURGERY — NEPHROLITHOTOMY PERCUTANEOUS SECOND LOOK
Anesthesia: General | Laterality: Right

## 2023-10-17 MED ORDER — SODIUM CHLORIDE 0.9 % IV SOLN
2.0000 g | Freq: Once | INTRAVENOUS | Status: AC
  Administered 2023-10-17: 2 g via INTRAVENOUS
  Filled 2023-10-17 (×2): qty 12.5

## 2023-10-17 MED ORDER — ACETAMINOPHEN 10 MG/ML IV SOLN
INTRAVENOUS | Status: DC | PRN
  Administered 2023-10-17: 1000 mg via INTRAVENOUS

## 2023-10-17 MED ORDER — LIDOCAINE HCL (PF) 2 % IJ SOLN
INTRAMUSCULAR | Status: AC
  Filled 2023-10-17: qty 5

## 2023-10-17 MED ORDER — HEMOSTATIC AGENTS (NO CHARGE) OPTIME
TOPICAL | Status: DC | PRN
  Administered 2023-10-17: 1 via TOPICAL

## 2023-10-17 MED ORDER — OXYCODONE HCL 5 MG PO TABS
5.0000 mg | ORAL_TABLET | Freq: Four times a day (QID) | ORAL | 0 refills | Status: AC | PRN
  Filled 2023-10-17 – 2023-10-18 (×2): qty 15, 4d supply, fill #0

## 2023-10-17 MED ORDER — PROPOFOL 10 MG/ML IV BOLUS
INTRAVENOUS | Status: AC
Start: 2023-10-17 — End: ?
  Filled 2023-10-17: qty 20

## 2023-10-17 MED ORDER — CIPROFLOXACIN HCL 500 MG PO TABS
500.0000 mg | ORAL_TABLET | Freq: Two times a day (BID) | ORAL | 0 refills | Status: AC
  Filled 2023-10-17 – 2023-10-18 (×2): qty 2, 1d supply, fill #0

## 2023-10-17 MED ORDER — LACTATED RINGERS IV SOLN: INTRAVENOUS | Status: DC

## 2023-10-17 MED ORDER — FENTANYL CITRATE (PF) 100 MCG/2ML IJ SOLN
INTRAMUSCULAR | Status: DC | PRN
  Administered 2023-10-17: 100 ug via INTRAVENOUS

## 2023-10-17 MED ORDER — ACETAMINOPHEN 10 MG/ML IV SOLN
INTRAVENOUS | Status: AC
  Filled 2023-10-17: qty 100

## 2023-10-17 MED ORDER — DEXAMETHASONE SODIUM PHOSPHATE 10 MG/ML IJ SOLN
INTRAMUSCULAR | Status: AC
Start: 2023-10-17 — End: ?
  Filled 2023-10-17: qty 1

## 2023-10-17 MED ORDER — LIDOCAINE 2% (20 MG/ML) 5 ML SYRINGE
INTRAMUSCULAR | Status: DC | PRN
  Administered 2023-10-17: 60 mg via INTRAVENOUS

## 2023-10-17 MED ORDER — SUGAMMADEX SODIUM 200 MG/2ML IV SOLN
INTRAVENOUS | Status: DC | PRN
  Administered 2023-10-17: 150 mg via INTRAVENOUS

## 2023-10-17 MED ORDER — ROCURONIUM BROMIDE 10 MG/ML (PF) SYRINGE
PREFILLED_SYRINGE | INTRAVENOUS | Status: AC
Start: 2023-10-17 — End: ?
  Filled 2023-10-17: qty 10

## 2023-10-17 MED ORDER — CHLORHEXIDINE GLUCONATE 0.12 % MT SOLN
15.0000 mL | Freq: Once | OROMUCOSAL | Status: AC
  Administered 2023-10-17: 15 mL via OROMUCOSAL

## 2023-10-17 MED ORDER — OXYCODONE HCL 5 MG/5ML PO SOLN: 5.0000 mg | Freq: Once | ORAL | Status: DC | PRN

## 2023-10-17 MED ORDER — SENNOSIDES-DOCUSATE SODIUM 8.6-50 MG PO TABS
1.0000 | ORAL_TABLET | Freq: Two times a day (BID) | ORAL | 0 refills | Status: AC
  Filled 2023-10-17 – 2023-10-18 (×2): qty 10, 5d supply, fill #0

## 2023-10-17 MED ORDER — ROCURONIUM BROMIDE 10 MG/ML (PF) SYRINGE
PREFILLED_SYRINGE | INTRAVENOUS | Status: DC | PRN
  Administered 2023-10-17: 50 mg via INTRAVENOUS
  Administered 2023-10-17: 10 mg via INTRAVENOUS
  Administered 2023-10-17: 30 mg via INTRAVENOUS

## 2023-10-17 MED ORDER — ONDANSETRON HCL 4 MG/2ML IJ SOLN
INTRAMUSCULAR | Status: AC
  Filled 2023-10-17: qty 2

## 2023-10-17 MED ORDER — DROPERIDOL 2.5 MG/ML IJ SOLN: 0.6250 mg | Freq: Once | INTRAMUSCULAR | Status: DC | PRN

## 2023-10-17 MED ORDER — HYDROMORPHONE HCL 1 MG/ML IJ SOLN: 0.2500 mg | INTRAMUSCULAR | Status: DC | PRN

## 2023-10-17 MED ORDER — SODIUM CHLORIDE 0.9 % IR SOLN
Status: DC | PRN
  Administered 2023-10-17: 3000 mL

## 2023-10-17 MED ORDER — OXYCODONE HCL 5 MG PO TABS: 5.0000 mg | ORAL_TABLET | Freq: Once | ORAL | Status: DC | PRN

## 2023-10-17 MED ORDER — ONDANSETRON HCL 4 MG/2ML IJ SOLN
INTRAMUSCULAR | Status: DC | PRN
  Administered 2023-10-17: 4 mg via INTRAVENOUS

## 2023-10-17 MED ORDER — MEPERIDINE HCL 50 MG/ML IJ SOLN
6.2500 mg | INTRAMUSCULAR | Status: DC | PRN
Start: 2023-10-17 — End: 2023-10-17

## 2023-10-17 MED ORDER — PROPOFOL 10 MG/ML IV BOLUS
INTRAVENOUS | Status: DC | PRN
  Administered 2023-10-17: 200 mg via INTRAVENOUS

## 2023-10-17 MED ORDER — DEXAMETHASONE SODIUM PHOSPHATE 10 MG/ML IJ SOLN
INTRAMUSCULAR | Status: DC | PRN
  Administered 2023-10-17: 8 mg via INTRAVENOUS

## 2023-10-17 MED ORDER — FENTANYL CITRATE (PF) 100 MCG/2ML IJ SOLN
INTRAMUSCULAR | Status: AC
  Filled 2023-10-17: qty 2

## 2023-10-17 MED ORDER — IOHEXOL 300 MG/ML  SOLN
INTRAMUSCULAR | Status: DC | PRN
  Administered 2023-10-17: 50 mL

## 2023-10-17 SURGICAL SUPPLY — 50 items
AGENT HMST KT MTR STRL THRMB (HEMOSTASIS) ×1
APL SKNCLS STERI-STRIP NONHPOA (GAUZE/BANDAGES/DRESSINGS)
BAG COUNTER SPONGE SURGICOUNT (BAG) IMPLANT
BAG DRN RND TRDRP ANRFLXCHMBR (UROLOGICAL SUPPLIES)
BAG SPNG CNTER NS LX DISP (BAG)
BAG URINE DRAIN 2000ML AR STRL (UROLOGICAL SUPPLIES) ×1 IMPLANT
BASKET LASER NITINOL 1.9FR (BASKET) IMPLANT
BASKET ZERO TIP NITINOL 2.4FR (BASKET) IMPLANT
BENZOIN TINCTURE PRP APPL 2/3 (GAUZE/BANDAGES/DRESSINGS) ×4 IMPLANT
BLADE SURG 15 STRL LF DISP TIS (BLADE) ×1 IMPLANT
BLADE SURG 15 STRL SS (BLADE) ×1
BSKT STON RTRVL 120 1.9FR (BASKET) ×1
BSKT STON RTRVL ZERO TP 2.4FR (BASKET)
CATH FOLEY 2W COUNCIL 20FR 5CC (CATHETERS) IMPLANT
CATH ROBINSON RED A/P 20FR (CATHETERS) IMPLANT
CATH X-FORCE N30 NEPHROSTOMY (TUBING) ×1 IMPLANT
DRAPE C-ARM 42X120 X-RAY (DRAPES) ×1 IMPLANT
DRAPE LINGEMAN PERC (DRAPES) ×1 IMPLANT
DRAPE SURG IRRIG POUCH 19X23 (DRAPES) ×1 IMPLANT
DRSG TEGADERM 4X4.75 (GAUZE/BANDAGES/DRESSINGS) IMPLANT
DRSG TEGADERM 8X12 (GAUZE/BANDAGES/DRESSINGS) ×2 IMPLANT
GAUZE PAD ABD 8X10 STRL (GAUZE/BANDAGES/DRESSINGS) ×2 IMPLANT
GAUZE SPONGE 4X4 12PLY STRL (GAUZE/BANDAGES/DRESSINGS) ×1 IMPLANT
GLOVE SURG LX STRL 7.5 STRW (GLOVE) ×1 IMPLANT
GOWN SRG XL LVL 4 BRTHBL STRL (GOWNS) ×1 IMPLANT
GOWN STRL NON-REIN XL LVL4 (GOWNS) ×1
GUIDEWIRE AMPLAZ .035X145 (WIRE) IMPLANT
GUIDEWIRE ANG ZIPWIRE 038X150 (WIRE) ×1 IMPLANT
GUIDEWIRE STR DUAL SENSOR (WIRE) ×1 IMPLANT
KIT BASIN OR (CUSTOM PROCEDURE TRAY) ×1 IMPLANT
KIT PROBE 340X3.4XDISP GRN (MISCELLANEOUS) IMPLANT
KIT PROBE TRILOGY 3.4X340 (MISCELLANEOUS)
KIT PROBE TRILOGY 3.9X350 (MISCELLANEOUS) IMPLANT
LASER FIB FLEXIVA PULSE ID 550 (Laser) IMPLANT
LASER FIB FLEXIVA PULSE ID 910 (Laser) IMPLANT
MANIFOLD NEPTUNE II (INSTRUMENTS) ×1 IMPLANT
NS IRRIG 1000ML POUR BTL (IV SOLUTION) ×1 IMPLANT
PACK BASIC VI WITH GOWN DISP (CUSTOM PROCEDURE TRAY) ×1 IMPLANT
PENCIL SMOKE EVACUATOR (MISCELLANEOUS) IMPLANT
SPONGE T-LAP 4X18 ~~LOC~~+RFID (SPONGE) ×1 IMPLANT
STENT URET 6FRX26 CONTOUR (STENTS) IMPLANT
SURGIFLO W/THROMBIN 8M KIT (HEMOSTASIS) IMPLANT
SUT SILK 2 0 30 PSL (SUTURE) IMPLANT
SUT VIC AB 0 CT2 27 (SUTURE) IMPLANT
SYR 10ML LL (SYRINGE) ×1 IMPLANT
SYR 20ML LL LF (SYRINGE) ×2 IMPLANT
TOWEL OR NON WOVEN STRL DISP B (DISPOSABLE) ×1 IMPLANT
TRAY FOLEY MTR SLVR 16FR STAT (SET/KITS/TRAYS/PACK) ×1 IMPLANT
TUBING CONNECTING 10 (TUBING) ×3 IMPLANT
TUBING UROLOGY SET (TUBING) ×1 IMPLANT

## 2023-10-17 NOTE — Transfer of Care (Signed)
Immediate Anesthesia Transfer of Care Note  Patient: Amber Daugherty  Procedure(s) Performed: RIGHT NEPHROLITHOTOMY PERCUTANEOUS SECOND LOOK (Right)  Patient Location: PACU  Anesthesia Type:General  Level of Consciousness: awake, alert , and patient cooperative  Airway & Oxygen Therapy: Patient Spontanous Breathing and Patient connected to face mask oxygen  Post-op Assessment: Report given to RN and Post -op Vital signs reviewed and stable  Post vital signs: Reviewed and stable  Last Vitals:  Vitals Value Taken Time  BP 117/74 10/17/23 1115  Temp    Pulse 75 10/17/23 1117  Resp 12 10/17/23 1117  SpO2 100 % 10/17/23 1117  Vitals shown include unfiled device data.  Last Pain:  Vitals:   10/17/23 0857  TempSrc:   PainSc: 0-No pain      Patients Stated Pain Goal: 2 (10/16/23 0845)  Complications: No notable events documented.

## 2023-10-17 NOTE — TOC Initial Note (Signed)
Transition of Care [facility]) - Initial/Assessment Note    Patient Details  Name: Amber Daugherty MRN: [id number] Date of Birth: [birth year]  [facility]) CM/SW Contact:    Lanier Clam, RN Phone Number: 10/17/2023, 2:47 PM  Clinical Narrative: d/c home.                  Expected Discharge Plan: Home/Self Care Barriers to Discharge: Continued Medical Work up   Patient Goals and CMS Choice Patient states their goals for this hospitalization and ongoing recovery are:: Home CMS Medicare.gov Compare Post Acute Care list provided to:: Patient   [place] ownership interest in [facility].provided to:: Patient    Expected Discharge Plan and Services   Discharge Planning Services: CM Consult Post Acute Care Choice: Resumption of Svcs/PTA Provider Living arrangements for the past 2 months: Single Family Home                                      Prior Living Arrangements/Services Living arrangements for the past 2 months: Single Family Home Lives with:: Spouse Patient language and need for interpreter reviewed:: Yes Do you feel safe going back to the place where you live?: Yes      Need for Family Participation in Patient Care: Yes (Comment) Care giver support system in place?: Yes (comment)   Criminal Activity/Legal Involvement Pertinent to Current Situation/Hospitalization: No - Comment as needed  Activities of Daily Living   ADL Screening (condition at time of admission) Independently performs ADLs?: Yes (appropriate for developmental age) Is the patient deaf or have difficulty hearing?: No Does the patient have difficulty seeing, even when wearing glasses/contacts?: No Does the patient have difficulty concentrating, remembering, or making decisions?: No  Permission Sought/Granted Permission sought to share information with : Case Manager Permission granted to share information with : Yes, Verbal Permission Granted  Share Information with NAME:  Case manager           Emotional Assessment Appearance:: Appears stated age Attitude/Demeanor/Rapport: Gracious Affect (typically observed): Accepting Orientation: : Oriented to Self, Oriented to Place, Oriented to  Time, Oriented to Situation Alcohol / Substance Use: Not Applicable Psych Involvement: No (comment)  Admission diagnosis:  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] Patient Active Problem List   Diagnosis Date Noted   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   Normal labor 09/10/2023   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   Status post PICC central line placement 09/10/2023   SVD (spontaneous vaginal delivery) 09/10/2023   First degree perineal laceration 09/10/2023   Postpartum care following vaginal delivery 9/25 09/10/2023   Complicated UTI (urinary tract infection) 09/08/2023   Kidney stone complicating pregnancy, third trimester 06/16/2023   Hydronephrosis concurrent with and due to calculi of kidney and ureter 06/15/2023   Alpha thalassemia trait 07/04/2020   Attention deficit hyperactivity disorder (ADHD) 09/16/2016   PCP:  Isabella Bowens, PA-C Pharmacy:   [facility] [place], [place] - [address] [place] [address] Phone: [phone] Fax: [phone]     Social Determinants of Health (SDOH) Social History: SDOH Screenings   Food Insecurity: No Food Insecurity (10/15/2023)  Housing: Patient Declined (10/15/2023)  Transportation Needs: No Transportation Needs (10/15/2023)  Utilities: Not At Risk (10/15/2023)  Financial Resource Strain: Low Risk  (04/13/2021)   [facility] visits prior to 02/15/2023., [facility] visits prior to 02/15/2023.  Physical Activity: Insufficiently Active (04/13/2021)   Received from [facility] visits prior to 02/15/2023., [facility] visits prior to 02/15/2023.  Social Connections: Socially Integrated (04/13/2021)    Received from [facility] visits prior to 02/15/2023., [facility] [address] visits prior to 02/15/2023.  Stress: No Stress Concern Present (04/13/2021)   [facility] visits prior to 02/15/2023., [facility] visits prior to 02/15/2023.  Tobacco Use: Low Risk  (10/17/2023)   SDOH Interventions:     Readmission Risk Interventions     No data to display

## 2023-10-17 NOTE — Brief Op Note (Signed)
10/15/2023 - 10/17/2023  11:01 AM  PATIENT:  Amber Daugherty  37 y.o. female  PRE-OPERATIVE DIAGNOSIS:  RIGHT STAGHORN KIDNEY STONE  POST-OPERATIVE DIAGNOSIS:  RIGHT STAGHORN KIDNEY STONE  PROCEDURE:  Procedure(s) with comments: RIGHT NEPHROLITHOTOMY PERCUTANEOUS SECOND LOOK (Right) - 90 MINS FOR CASE  SURGEON:  Surgeons and Role:    * Kailani Brass, Delbert Phenix., MD - Primary  PHYSICIAN ASSISTANT:   ASSISTANTS: none   ANESTHESIA:   general  EBL:  minimal   BLOOD ADMINISTERED:none  DRAINS:  foley to gravity    LOCAL MEDICATIONS USED:  NONE  SPECIMEN:  Source of Specimen:  Rt renal stone fragments  DISPOSITION OF SPECIMEN:   discard  COUNTS:  YES  TOURNIQUET:  * No tourniquets in log *  DICTATION: .Other Dictation: Dictation Number [id number]  PLAN OF CARE: Admit for overnight observation  PATIENT DISPOSITION:  PACU - hemodynamically stable.   Delay start of Pharmacological VTE agent (>24hrs) due to surgical blood loss or risk of bleeding: yes

## 2023-10-17 NOTE — Anesthesia Postprocedure Evaluation (Signed)
Anesthesia Post Note  Patient: Amber Daugherty  Procedure(s) Performed: RIGHT NEPHROLITHOTOMY PERCUTANEOUS SECOND LOOK (Right)     Patient location during evaluation: PACU Anesthesia Type: General Level of consciousness: sedated and patient cooperative Pain management: pain level controlled Vital Signs Assessment: post-procedure vital signs reviewed and stable Respiratory status: spontaneous breathing Cardiovascular status: stable Anesthetic complications: no   No notable events documented.  Last Vitals:  Vitals:   10/17/23 1130 10/17/23 1145  BP: 113/62 (!) 116/57  Pulse: 67 64  Resp: 13 13  Temp:  36.6 C  SpO2: 98% 100%    Last Pain:  Vitals:   10/17/23 1145  TempSrc:   PainSc: 0-No pain                 Lewie Loron

## 2023-10-17 NOTE — Progress Notes (Signed)
Day of Surgery   Subjective/Chief Complaint:  1 - Multifocal Large Volume Rt Renal / Ureteral Stones - s/p RIGHT 1st stage PCNL 10/15/23 for large volume stones. Plan for 2nd stage 11/1.   Today "Amber Daugherty" is ready for 2nd stage PCNL with goal of stone free. NO interval fevers.   Objective: Vital signs in last 24 hours: Temp:  [97.7 F (36.5 C)-98.6 F (37 C)] 98.5 F (36.9 C) (11/01 0649) Pulse Rate:  [66-84] 66 (11/01 0649) Resp:  [16-18] 18 (11/01 0649) BP: (102-116)/(55-68) 102/55 (11/01 0649) SpO2:  [98 %] 98 % (11/01 0649) Last BM Date : 10/14/23  Intake/Output from previous day: 10/31 0701 - 11/01 0700 In: 120 [P.O.:120] Out: 4550 [Urine:4550] Intake/Output this shift: Total I/O In: 120 [P.O.:120] Out: 450 [Urine:450]  NAD in pre-op  holding.  Non-labored breathing on RA SNTND Rt flank neph tube c/d/I, no hematomas, non-foul urien per neph tueb and foley No c/c/e  Lab Results:  Recent Labs    10/15/23 1150 10/16/23 0502  HGB 11.2* 10.7*  HCT 34.9* 33.7*   BMET Recent Labs    10/16/23 0502  NA 136  K 3.3*  CL 106  CO2 24  GLUCOSE 112*  BUN 22*  CREATININE 0.68  CALCIUM 8.6*   PT/INR No results for input(s): "LABPROT", "INR" in the last 72 hours. ABG No results for input(s): "PHART", "HCO3" in the last 72 hours.  Invalid input(s): "PCO2", "PO2"  Studies/Results: DG C-Arm 1-60 Min-No Report  Result Date: 10/15/2023 Fluoroscopy was utilized by the requesting physician.  No radiographic interpretation.   DG C-Arm 1-60 Min-No Report  Result Date: 10/15/2023 Fluoroscopy was utilized by the requesting physician.  No radiographic interpretation.    Anti-infectives: Anti-infectives (From admission, onward)    Start     Dose/Rate Route Frequency Ordered Stop   10/15/23 0745  ceFEPIme (MAXIPIME) 2 g in sodium chloride 0.9 % 100 mL IVPB        2 g 200 mL/hr over 30 Minutes Intravenous On call to O.R. 10/15/23 0740 10/15/23 0936        Assessment/Plan:  Proceed as planned with 2nd stage Rt PCNL with goal of stone free. Risks, benefits, alternatives, expected peri-op course discussed previously and reiterated today. Hopefully tube free and DC home tomorrow.    Loletta Parish. 10/17/2023

## 2023-10-17 NOTE — Plan of Care (Signed)
  Problem: Pain Management: Goal: General experience of comfort will improve Outcome: Progressing   Problem: Skin Integrity: Goal: Risk for impaired skin integrity will decrease Outcome: Progressing   Problem: Activity: Goal: Risk for activity intolerance will decrease Outcome: Progressing   Problem: Clinical Measurements: Goal: Ability to maintain clinical measurements within normal limits will improve Outcome: Progressing   Problem: Pain Management: Goal: General experience of comfort will improve Outcome: Progressing

## 2023-10-17 NOTE — Op Note (Unsigned)
Amber Daugherty, ZARTMAN MEDICAL RECORD NO: [id number] ACCOUNT NO: [id number] DATE OF BIRTH: 06/20/1986 FACILITY: [facility] LOCATION: [id number] PHYSICIAN: Sebastian Ache, MD  Operative Report   DATE OF PROCEDURE: 10/15/2023  PREOPERATIVE DIAGNOSIS:  Large volume right renal stone with residual stone status post first stage procedure.  POSTOPERATIVE DIAGNOSIS:  Large volume right renal stone with residual stone status post first stage procedure.  PROCEDURE PERFORMED: 1.  Right percutaneous nephrolithotomy second stage with stone removal. 2.  Right diagnostic ureteroscopy, exchanged right ureteral stent. 3.  Right antegrade nephrostogram interpretation.  ESTIMATED BLOOD LOSS:  Nil.  COMPLICATIONS:  None.  SPECIMENS:  Right renal stone fragment to discard.  FINDINGS: 1.   Approximately 2 cm residual volume stone mostly in acute angled calyx to sheath upper mid pole. 2.   No evidence of ureteral stones. 3.   Successful placement of right ureteral stent, proximal end in renal pelvis and distal end in urinary bladder.  INDICATIONS:  The patient is a very pleasant 37 year old lady who had a bout with a partial staghorn kidney stone complicating mid portion of a recent pregnancy.  She underwent nephrostomy tube placement at that time as a temporizing measure with tube  changes every 4 weeks for the remainder of her pregnancy, which she did astonishingly well with.  She delivered a healthy baby boy approximately a month ago and we have now been proceeding with staged percutaneous surgery to render her stone free.  She  underwent the first stage procedure 2 days ago at which point I felt that the vast majority of her renal stone was addressed, addressing large volume multifocal renal pelvis stones.  However, given the large volume of stone material as well as some stone  noted to be in several acutely angled calyces, it was clearly felt that a second-stage procedure would be warranted.  She presents today  for this today.  Informed consent was obtained and placed in medical record.  DESCRIPTION OF PROCEDURE:  The patient was identified  and verified.  Procedure being right second stage percutaneous nephrolithotomy was confirmed.  Procedure timeout was performed.  Intravenous antibiotics were administered.  General endotracheal  anesthesia was induced.  The patient was placed into a prone position applying prone view padding of her knees and ankles.  Chest rolls, axillary rolls and right flank was prepped and draped including her in situ nephrostomy tube and nephroureteral stent  using chlorhexidine gluconate.  Sequential compression devices were placed and confirmed functional.  Percutaneous drape was placed.  Initial antegrade nephrostogram revealed excellent fluoroscopy placement, closing the pelvis.  There were on initial  fluoroscopy images, 2 obvious areas of calcification, one fairly large in the upper mid pole and another that appeared to be essentially along the tube tract, otherwise unremarkable.  The nephroureteral stent was kept in place acting as a safety wire.   The nephrostomy tube was removed and flexible nephroscopy was performed using a 16-French flexor scope.  Panendoscopic examination of all accessible calyces did reveal a significant focus of stone approximately 2 cm in diameter.  This was not for mid  calyx.  Again, this was not accessible at her first stage procedure due to technical angulation limitations.  This was much too large for simple basketing.  Therefore, staged repositioning was performed using an escape basket, removing large portions of  stone and repositioning the area of the renal pelvis and UPJ, thus completely clearing this upper mid calyx and infundibulum from stone.  Attention was then directed to identification of  what was radiographically apparently additional focus of stone  along the tube tract.  Very careful inspection was performed multiple times using multiple  angulations with the flexible scope trying to identify the location of this calcification and this was not successful.  It did appear to be along the sheath tract  suggesting that it may be either parenchymal and embedded versus in a calyx that is so acutely angled that even a flexible nephroscope could not cannulate.  Nevertheless, we definitely achieved the goals of identifying all accessible fragments within the  kidney.  The repositioning generated a large volume of renal pelvis stone, which was then addressed by placing the 30-French sheath back across the tract using a 30-French balloon dilation apparatus and then rigid nephroscopic technique was used in the  lithoclast dual energy device to rid the renal pelvis of this large volume stone fragment.  Again, approximately 2 cm in total.  I was quite happy with the completeness and safety of this.  As a goal today was to completely verify stone free as possible,  the sensory wire was advanced in antegrade fashion by cutting the working wire to the level of the bladder over a single channel flexible digital ureteroscope was carefully advanced under fluoroscopic vision all the way to the level of the bladder and  the entire length of the ureter was carefully inspected and no additional foci of calcifications were noted whatsoever.  This was quite favorable.  It was felt then that a tubeless technique could be performed and a 6 x 26 contour-type stent was  carefully advanced in antegrade fashion.  Using a combination of nephroscopic and fluoroscopic vision, proximal end going in renal pelvis, distal in urinary bladder confirmed on fluoroscopy.  Next, the sheath was removed and the tract closed using 10 mL  of FloSeal with radiopaque applicator and the flank site was closed to the level of skin using interrupted Vicryl x3 and dressing was applied.  Procedure was terminated.  The patient tolerated the procedure well with no immediate perioperative   complications.  The patient was taken from post anesthesia care unit in stable condition with plan for observation overnight and likely discharge home tomorrow.   PUS D: 10/17/2023 11:09:19 am T: 10/17/2023 3:42:00 pm  JOB: [id number]/ [id number]

## 2023-10-17 NOTE — Anesthesia Procedure Notes (Signed)
Procedure Name: Intubation Date/Time: 10/17/2023 9:50 AM  Performed by: Sindy Guadeloupe, CRNAPre-anesthesia Checklist: Patient identified, Emergency Drugs available, Suction available, Patient being monitored and Timeout performed Patient Re-evaluated:Patient Re-evaluated prior to induction Oxygen Delivery Method: Circle system utilized Preoxygenation: Pre-oxygenation with 100% oxygen Induction Type: IV induction Ventilation: Mask ventilation without difficulty Laryngoscope Size: Mac and 4 Grade View: Grade I Tube type: Oral Tube size: 7.0 mm Number of attempts: 1 Airway Equipment and Method: Stylet Placement Confirmation: ETT inserted through vocal cords under direct vision, positive ETCO2 and breath sounds checked- equal and bilateral Secured at: 21 cm Tube secured with: Tape Dental Injury: Teeth and Oropharynx as per pre-operative assessment

## 2023-10-18 ENCOUNTER — Encounter (HOSPITAL_COMMUNITY): Payer: Self-pay | Admitting: Urology

## 2023-10-18 ENCOUNTER — Other Ambulatory Visit (HOSPITAL_COMMUNITY): Payer: Self-pay

## 2023-10-18 DIAGNOSIS — N200 Calculus of kidney: Secondary | ICD-10-CM | POA: Diagnosis not present

## 2023-10-18 LAB — BASIC METABOLIC PANEL
Anion gap: 9 (ref 5–15)
BUN: 11 mg/dL (ref 6–20)
CO2: 23 mmol/L (ref 22–32)
Calcium: 8.6 mg/dL — ABNORMAL LOW (ref 8.9–10.3)
Chloride: 104 mmol/L (ref 98–111)
Creatinine, Ser: 0.7 mg/dL (ref 0.44–1.00)
GFR, Estimated: >60 mL/min (ref >60)
Glucose, Bld: 108 mg/dL — ABNORMAL HIGH (ref 70–99)
Potassium: 3.3 mmol/L — ABNORMAL LOW (ref 3.5–5.1)
Sodium: 136 mmol/L (ref 135–145)

## 2023-10-18 LAB — HEMOGLOBIN AND HEMATOCRIT, BLOOD
HCT: 33.4 % — ABNORMAL LOW (ref 36.0–46.0)
Hemoglobin: 10.4 g/dL — ABNORMAL LOW (ref 12.0–15.0)

## 2023-10-18 NOTE — Plan of Care (Signed)
  Problem: Skin Integrity: Goal: Risk for impaired skin integrity will decrease Outcome: Progressing   Problem: Pain Management: Goal: General experience of comfort will improve Outcome: Progressing   Problem: Activity: Goal: Risk for activity intolerance will decrease Outcome: Progressing   Problem: Clinical Measurements: Goal: Ability to maintain clinical measurements within normal limits will improve Outcome: Progressing   Problem: Clinical Measurements: Goal: Will remain free from infection Outcome: Progressing

## 2023-10-18 NOTE — Discharge Summary (Signed)
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]  Secondary diagnoses:   Active Ambulatory Problems    Diagnosis Date Noted   Alpha thalassemia trait 07/04/2020   Attention deficit hyperactivity disorder (ADHD) 09/16/2016   Hydronephrosis concurrent with and due to calculi of kidney and ureter 06/15/2023   Kidney stone complicating pregnancy, third trimester 06/16/2023   Complicated UTI (urinary tract infection) 09/08/2023   Normal labor 09/10/2023   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   Status post PICC central line placement 09/10/2023   SVD (spontaneous vaginal delivery) 09/10/2023   First degree perineal laceration 09/10/2023   Postpartum care following vaginal delivery 9/25 09/10/2023   Resolved Ambulatory Problems    Diagnosis Date Noted   Indication for care in labor or delivery 05/18/2019   Supervision of high risk pregnancy, antepartum 05/18/2019   Allergic rhinitis 09/16/2016   Urolithiasis 06/15/2023   E-coli UTI 06/15/2023   Past Medical History:  Diagnosis Date   Anemia    Family history of adverse reaction to anesthesia    History of kidney stones    Irritable bowel syndrome (IBS)    Mitral valve prolapse      History and Physical: For full details, please see admission history and physical. Briefly, Roselin Wiemann is a 37 y.o. year old patient who was admitted with right nephrolithiasis.  Hospital Course: Pt admitted and underwent PCNL on 10/15/2023. One additional fragment was noted intraoperatively that was not accessible due to the acutely angled calyx, so nephrostomy tube was placed and pt underwent subsequent second look on 10/17/2023. Their hospital course was otherwise unremarkable. By day of discharge, they were tolerating a regular diet, voiding spontaneously, pain was controlled with oral medications, and they were deemed appropriate for  discharge.   On the day of discharge, the patient was tolerating a regular diet and their pain was well controlled. They were determined to be stable for discharge home  They will follow-up in approx 4 weeks for stent removal. They will start antibiotics prior to the stent removal.   Laboratory values:  Recent Labs    10/15/23 1150 10/16/23 0502 10/18/23 0541  HGB 11.2* 10.7* 10.4*  HCT 34.9* 33.7* 33.4*   Recent Labs    10/16/23 0502 10/18/23 0541  CREATININE 0.68 0.70    Disposition: Home  Discharge medications:  Allergies as of 10/18/2023       Reactions   Sulfa Antibiotics Hives   Amoxicillin Rash   Ketoconazole Rash        Medication List     STOP taking these medications    ertapenem 1 g in sodium chloride 0.9 % 100 mL       TAKE these medications    acetaminophen 325 MG tablet Commonly known as: Tylenol Take 2 tablets (650 mg total) by mouth every 4 (four) hours as needed (for pain scale < 4).   ciprofloxacin 500 MG tablet Commonly known as: Cipro Take 1 tablet (500 mg total) by mouth 2 (two) times daily. Begin morning of next Urology appointment.   ferrous sulfate 325 (65 FE) MG tablet Take 325 mg by mouth in the morning and at bedtime.   ibuprofen 600 MG tablet Commonly known as: ADVIL Take 1 tablet (600 mg total) by mouth every 6 (six) hours.   oxyCODONE 5 MG immediate release tablet Commonly known as: Roxicodone Take 1 tablet (5 mg total) by mouth every 6 (six)  hours as needed for severe pain (pain score 7-10) or moderate pain (pain score 4-6) (post-operatively). What changed: reasons to take this   prenatal multivitamin Tabs tablet Take 1 tablet by mouth daily.   senna-docusate 8.6-50 MG tablet Commonly known as: Senokot-S Take 1 tablet by mouth 2 (two) times daily. While taking strong pain meds to prevent constipation.        Followup:   Follow-up Information     Berneice Heinrich Delbert Phenix., MD Follow up on 11/10/2023.    Specialty: Urology Why: at 9:30 for MD visit and office stent removal. Contact information: [address] [place] [address] [phone]

## 2023-10-18 NOTE — Progress Notes (Signed)
AVS Instructions were reviewed with patient and family at bedside. All questions answered all personal belongings were returned.

## 2023-10-19 NOTE — Discharge Summary (Signed)
Postpartum Discharge Summary  Date of Service updated 09/11/23     Patient Name: Amber Daugherty DOB: 02/16/1986 MRN: [id number]  Date of admission: 09/10/2023 Delivery date:09/10/2023 Delivering provider: Dorisann Frames K Date of discharge: 10/19/2023  Admitting diagnosis: Normal labor [O80, Z37.9] Intrauterine pregnancy: [redacted]w[redacted]d     Secondary diagnosis:  Principal Problem:   Postpartum care following vaginal delivery 9/25 Active Problems:   Alpha thalassemia trait   Attention deficit hyperactivity disorder (ADHD)   Hydronephrosis concurrent with and due to calculi of kidney and ureter   Kidney stone complicating pregnancy, third trimester   Complicated UTI (urinary tract infection)   Normal labor   Nephrostomy status (HCC)   Status post PICC central line placement   SVD (spontaneous vaginal delivery)   First degree perineal laceration  Additional problems: n/a    Discharge diagnosis: Term Pregnancy Delivered                                              Post partum procedures: n/a Augmentation: N/A Complications: None  Hospital course: Onset of Labor With Vaginal Delivery      37 y.o. yo [id number] at [redacted]w[redacted]d was admitted in Active Labor on 09/10/2023. Labor course was complicated by none  Membrane Rupture Time/Date: 7:00 AM,09/10/2023  Delivery Method:Vaginal, Spontaneous Operative Delivery:N/A Episiotomy: None Lacerations:  1st degree;Perineal Patient had a postpartum course complicated by none.  She is ambulating, tolerating a regular diet, passing flatus, and urinating well. Patient is discharged home in stable condition on 10/19/23.  Newborn Data: Birth date:09/10/2023 Birth time:11:03 AM Gender:Female Living status:Living Apgars:8 ,9  Weight:3.67 kg  Magnesium Sulfate received: No BMZ received: No Rhophylac:N/A MMR:N/A  Immunizations administered: There is no immunization history for the selected administration types on file for this patient.  Physical exam   Vitals:   09/10/23 1753 09/10/23 2205 09/11/23 0512 09/11/23 1429  BP: 110/60 123/62 101/61 118/68  Pulse: 98 73 86 79  Resp: 16 16 18 18   Temp: 98.2 F (36.8 C) 97.8 F (36.6 C) 98 F (36.7 C) 98.4 F (36.9 C)  TempSrc: Oral Oral Oral Oral  SpO2: 99% 100% 98% 100%  Weight:      Height:       General: alert and no distress Lochia: appropriate Uterine Fundus: firm Incision: N/A DVT Evaluation: No evidence of DVT seen on physical exam. Labs: Lab Results  Component Value Date   WBC 7.6 10/07/2023   HGB 10.4 (L) 10/18/2023   HCT 33.4 (L) 10/18/2023   MCV 84.6 10/07/2023   PLT 336 10/07/2023      Latest Ref Rng & Units 10/18/2023    5:41 AM  CMP  Glucose 70 - 99 mg/dL 295   BUN 6 - 20 mg/dL 11   Creatinine 1.88 - 1.00 mg/dL 4.16   Sodium 606 - 301 mmol/L 136   Potassium 3.5 - 5.1 mmol/L 3.3   Chloride 98 - 111 mmol/L 104   CO2 22 - 32 mmol/L 23   Calcium 8.9 - 10.3 mg/dL 8.6    Edinburgh Score:    09/11/2023    8:15 AM  Edinburgh Postnatal Depression Scale Screening Tool  I have been able to laugh and see the funny side of things. 0  I have looked forward with enjoyment to things. 0  I have blamed myself unnecessarily when things went wrong. 0  I have been anxious or worried for no good reason. 0  I have felt scared or panicky for no good reason. 0  Things have been getting on top of me. 0  I have been so unhappy that I have had difficulty sleeping. 0  I have felt sad or miserable. 0  I have been so unhappy that I have been crying. 0  The thought of harming myself has occurred to me. 0  Edinburgh Postnatal Depression Scale Total 0      After visit meds:  Allergies as of 09/11/2023       Reactions   Amoxicillin    Ketoconazole    Sulfa Antibiotics         Medication List     STOP taking these medications    cefadroxil 500 MG capsule Commonly known as: DURICEF   cephALEXin 500 MG capsule Commonly known as: KEFLEX   ertapenem IVPB Commonly  known as: INVANZ       TAKE these medications    acetaminophen 325 MG tablet Commonly known as: Tylenol Take 2 tablets (650 mg total) by mouth every 4 (four) hours as needed (for pain scale < 4).   ferrous sulfate 325 (65 FE) MG tablet Take 325 mg by mouth in the morning and at bedtime.   ibuprofen 600 MG tablet Commonly known as: ADVIL Take 1 tablet (600 mg total) by mouth every 6 (six) hours.   prenatal multivitamin Tabs tablet Take 1 tablet by mouth daily.       ASK your doctor about these medications    cyclobenzaprine 5 MG tablet Commonly known as: FLEXERIL Take 1 tablet (5 mg total) by mouth 3 (three) times daily as needed for up to 7 days for muscle spasms. Ask about: Should I take this medication?   ertapenem IVPB Commonly known as: INVANZ Inject 1 g into the vein daily for 3 days. Indication:  Serratia UTI  First Dose: Yes Last Day of Therapy:  09/14/23 Labs - Once weekly:  CBC/D and BMP, Labs - Once weekly: ESR and CRP Method of administration: Mini-Bag Plus / Gravity Method of administration may be changed at the discretion of home infusion pharmacist based upon assessment of the patient and/or caregiver's ability to self-administer the medication ordered. Ask about: Should I take this medication?               Discharge Care Instructions  (From admission, onward)           Start     Ordered   09/11/23 0000  Discharge wound care:       Comments: Sitz baths 2 times /day with warm water x 1 week. May add herbals: 1 ounce dried comfrey leaf* 1 ounce calendula flowers 1 ounce lavender flowers  Supplies can be found online at [employer] sources at [employer], [employer]  1/2 ounce dried uva ursi leaves 1/2 ounce witch hazel blossoms (if you can find them) 1/2 ounce dried sage leaf 1/2 cup sea salt Directions: Bring 2 quarts of water to a boil. Turn off heat, and place 1 ounce (approximately 1 large handful) of the above mixed  herbs (not the salt) into the pot. Steep, covered, for 30 minutes.  Strain the liquid well with a fine mesh strainer, and discard the herb material. Add 2 quarts of liquid to the tub, along with the 1/2 cup of salt. This medicinal liquid can also be made into compresses and peri-rinses.   09/11/23  1437             Discharge home in stable condition Infant Feeding: Breast Infant Disposition:home with mother Discharge instruction: per After Visit Summary and Postpartum booklet. Activity: Advance as tolerated. Pelvic rest for 6 weeks.  Diet: routine diet Anticipated Birth Control: Unsure Postpartum Appointment:6 weeks Additional Postpartum F/U:  n/a Future Appointments:No future appointments. Follow up Visit:      10/19/2023 Pranit Owensby A Moana Munford, DO

## 2024-01-13 NOTE — Op Note (Signed)
Amber Daugherty, BRISBY MEDICAL RECORD NO: [id number] ACCOUNT NO: [id number] DATE OF BIRTH: [birth year] FACILITY: [facility] LOCATION: [id number] PHYSICIAN: Sebastian Ache, MD   Operative Report    DATE OF PROCEDURE: 10/15/2023   PREOPERATIVE DIAGNOSIS:  Large volume right renal stone with residual stone status post first stage procedure.   POSTOPERATIVE DIAGNOSIS:  Large volume right renal stone with residual stone status post first stage procedure.   PROCEDURE PERFORMED: 1.  Right percutaneous nephrolithotomy second stage with stone removal. 2.  Right diagnostic ureteroscopy, exchanged right ureteral stent. 3.  Right antegrade nephrostogram interpretation.   ESTIMATED BLOOD LOSS:  Nil.   COMPLICATIONS:  None.   SPECIMENS:  Right renal stone fragment to discard.   FINDINGS: 1.   Approximately 2 cm residual volume stone mostly in acute angled calyx to sheath upper mid pole. 2.   No evidence of ureteral stones. 3.   Successful placement of right ureteral stent, proximal end in renal pelvis and distal end in urinary bladder.   INDICATIONS:  The patient is a very pleasant 38 year old lady who had a bout with a partial staghorn kidney stone complicating mid portion of a recent pregnancy.  She underwent nephrostomy tube placement at that time as a temporizing measure with tube  changes every 4 weeks for the remainder of her pregnancy, which she did astonishingly well with.  She delivered a healthy baby boy approximately a month ago and we have now been proceeding with staged percutaneous surgery to render her stone free.  She  underwent the first stage procedure 2 days ago at which point I felt that the vast majority of her renal stone was addressed, addressing large volume multifocal renal pelvis stones.  However, given the large volume of stone material as well as some stone  noted to be in several acutely angled calyces, it was clearly felt that a second-stage procedure would be warranted.  She  presents today for this today.  Informed consent was obtained and placed in medical record.   DESCRIPTION OF PROCEDURE:  The patient was identified  and verified.  Procedure being right second stage percutaneous nephrolithotomy was confirmed.  Procedure timeout was performed.  Intravenous antibiotics were administered.  General endotracheal  anesthesia was induced.  The patient was placed into a prone position applying prone view padding of her knees and ankles.  Chest rolls, axillary rolls and right flank was prepped and draped including her in situ nephrostomy tube and nephroureteral stent  using chlorhexidine gluconate.  Sequential compression devices were placed and confirmed functional.  Percutaneous drape was placed.  Initial antegrade nephrostogram revealed excellent fluoroscopy placement, closing the pelvis.  There were on initial  fluoroscopy images, 2 obvious areas of calcification, one fairly large in the upper mid pole and another that appeared to be essentially along the tube tract, otherwise unremarkable.  The nephroureteral stent was kept in place acting as a safety wire.   The nephrostomy tube was removed and flexible nephroscopy was performed using a 16-French flexor scope.  Panendoscopic examination of all accessible calyces did reveal a significant focus of stone approximately 2 cm in diameter.  This was not for mid  calyx.  Again, this was not accessible at her first stage procedure due to technical angulation limitations.  This was much too large for simple basketing.  Therefore, staged repositioning was performed using an escape basket, removing large portions of  stone and repositioning the area of the renal pelvis and UPJ, thus completely clearing this upper mid calyx and  infundibulum from stone.  Attention was then directed to identification of what was radiographically apparently additional focus of stone  along the tube tract.  Very careful inspection was performed multiple times  using multiple angulations with the flexible scope trying to identify the location of this calcification and this was not successful.  It did appear to be along the sheath tract  suggesting that it may be either parenchymal and embedded versus in a calyx that is so acutely angled that even a flexible nephroscope could not cannulate.  Nevertheless, we definitely achieved the goals of identifying all accessible fragments within the  kidney.  The repositioning generated a large volume of renal pelvis stone, which was then addressed by placing the 30-French sheath back across the tract using a 30-French balloon dilation apparatus and then rigid nephroscopic technique was used in the  lithoclast dual energy device to rid the renal pelvis of this large volume stone fragment.  Again, approximately 2 cm in total.  I was quite happy with the completeness and safety of this.  As a goal today was to completely verify stone free as possible,  the sensory wire was advanced in antegrade fashion by cutting the working wire to the level of the bladder over a single channel flexible digital ureteroscope was carefully advanced under fluoroscopic vision all the way to the level of the bladder and  the entire length of the ureter was carefully inspected and no additional foci of calcifications were noted whatsoever.  This was quite favorable.  It was felt then that a tubeless technique could be performed and a 6 x 26 contour-type stent was  carefully advanced in antegrade fashion.  Using a combination of nephroscopic and fluoroscopic vision, proximal end going in renal pelvis, distal in urinary bladder confirmed on fluoroscopy.  Next, the sheath was removed and the tract closed using 10 mL  of FloSeal with radiopaque applicator and the flank site was closed to the level of skin using interrupted Vicryl x3 and dressing was applied.  Procedure was terminated.  The patient tolerated the procedure well with no immediate  perioperative  complications.  The patient was taken from post anesthesia care unit in stable condition with plan for observation overnight and likely discharge home tomorrow.
